# Patient Record
Sex: Male | Born: 1975 | Race: Black or African American | Hispanic: No | Marital: Single | Smoking: Never smoker
Health system: Southern US, Community
[De-identification: ages and names within clinical notes are randomized; demographics above are authoritative.]

## PROBLEM LIST (undated history)

## (undated) DIAGNOSIS — Z8619 Personal history of other infectious and parasitic diseases: Secondary | ICD-10-CM

## (undated) DIAGNOSIS — F419 Anxiety disorder, unspecified: Secondary | ICD-10-CM

## (undated) DIAGNOSIS — U071 COVID-19: Secondary | ICD-10-CM

## (undated) DIAGNOSIS — F4310 Post-traumatic stress disorder, unspecified: Secondary | ICD-10-CM

## (undated) HISTORY — PX: KNEE ARTHROSCOPY: SHX127

## (undated) HISTORY — DX: Personal history of other infectious and parasitic diseases: Z86.19

---

## 2007-09-14 ENCOUNTER — Emergency Department (HOSPITAL_COMMUNITY): Admission: EM | Admit: 2007-09-14 | Discharge: 2007-09-14 | Payer: Self-pay | Admitting: Family Medicine

## 2010-01-20 ENCOUNTER — Emergency Department (HOSPITAL_COMMUNITY): Admission: EM | Admit: 2010-01-20 | Discharge: 2010-01-20 | Payer: Self-pay | Admitting: Emergency Medicine

## 2011-07-26 ENCOUNTER — Inpatient Hospital Stay (INDEPENDENT_AMBULATORY_CARE_PROVIDER_SITE_OTHER)
Admission: RE | Admit: 2011-07-26 | Discharge: 2011-07-26 | Disposition: A | Discharge status: Home / Self Care | Payer: Medicaid Other | Source: Ambulatory Visit | Attending: Family Medicine | Admitting: Family Medicine

## 2011-07-26 ENCOUNTER — Ambulatory Visit (INDEPENDENT_AMBULATORY_CARE_PROVIDER_SITE_OTHER): Payer: Medicaid Other

## 2011-07-26 DIAGNOSIS — W2209XA Striking against other stationary object, initial encounter: Secondary | ICD-10-CM

## 2011-07-26 DIAGNOSIS — S99929A Unspecified injury of unspecified foot, initial encounter: Secondary | ICD-10-CM

## 2011-07-26 DIAGNOSIS — M79609 Pain in unspecified limb: Secondary | ICD-10-CM

## 2011-07-26 DIAGNOSIS — S8990XA Unspecified injury of unspecified lower leg, initial encounter: Secondary | ICD-10-CM

## 2011-09-17 ENCOUNTER — Encounter: Payer: Self-pay | Admitting: Emergency Medicine

## 2011-09-17 ENCOUNTER — Emergency Department (HOSPITAL_COMMUNITY)
Admission: EM | Admit: 2011-09-17 | Discharge: 2011-09-17 | Disposition: A | Discharge status: Home / Self Care | Payer: Medicaid Other | Source: Home / Self Care

## 2011-09-17 DIAGNOSIS — J209 Acute bronchitis, unspecified: Secondary | ICD-10-CM

## 2011-09-17 MED ORDER — PROMETHAZINE-CODEINE 6.25-10 MG/5ML PO SYRP: ORAL_SOLUTION | ORAL | Status: AC

## 2011-09-17 MED ORDER — ALBUTEROL SULFATE HFA 108 (90 BASE) MCG/ACT IN AERS: 2.0000 | INHALATION_SPRAY | RESPIRATORY_TRACT | Status: DC | PRN

## 2011-09-17 NOTE — ED Notes (Signed)
Onset 09/15/2011.  Reports difficulty breathing with lying down--onset last night.  Burning in chest with cough, green phlegm, reports breathing worsens at night.  Sore throat on Tuesday, but has gone away.  Generalized weakness

## 2011-09-17 NOTE — ED Notes (Signed)
Denies comfort measures: blanket

## 2011-09-17 NOTE — ED Provider Notes (Signed)
History     CSN: [id number] Arrival date & time: 09/17/2011  8:55 AM   None     Chief Complaint  Patient presents with  . URI    (Consider location/radiation/quality/duration/timing/severity/associated sxs/prior treatment) HPI Comments: Onset of feeling weak yesterday while at work. Later began to cough. Left work, went home and took Tylenol Cold and Flu. Slept a couple of hrs then awoke with "chest hurting and coughed all night."  He felt short of breath but no wheezing. States he feels better this morning - no longer has chest discomfort or shortness of breath, and cough is less though continues. He has had a scratchy throat for one week and mild nasal congestion x 2 days. No fever.   Patient is a 35 y.o. male presenting with cough. The history is provided by the patient.  Cough This is a new problem. The current episode started 12 to 24 hours ago. The problem occurs every few minutes. The problem has been gradually improving. The cough is non-productive. There has been no fever. Associated symptoms include chest pain, chills, rhinorrhea and shortness of breath. Pertinent negatives include no ear pain, no sore throat, no myalgias and no wheezing. He has tried cough syrup for the symptoms. The treatment provided mild relief. His past medical history does not include asthma.    History reviewed. No pertinent past medical history.  Past Surgical History  Procedure Date  . Knee arthroscopy     left knee    History reviewed. No pertinent family history.  History  Substance Use Topics  . Smoking status: Never Smoker   . Smokeless tobacco: Not on file  . Alcohol Use: No      Review of Systems  Constitutional: Positive for chills. Negative for fever and fatigue.  HENT: Positive for rhinorrhea and postnasal drip. Negative for ear pain, sore throat, sneezing and sinus pressure.   Respiratory: Positive for cough and shortness of breath. Negative for wheezing.   Cardiovascular:  Positive for chest pain. Negative for palpitations.  Musculoskeletal: Negative for myalgias.    Allergies  Review of patient's allergies indicates no known allergies.  Home Medications   Current Outpatient Rx  Name Route Sig Dispense Refill  . OVER THE COUNTER MEDICATION       . ALBUTEROL SULFATE HFA 108 (90 BASE) MCG/ACT IN AERS Inhalation Inhale 2 puffs into the lungs every 4 (four) hours as needed for wheezing. 1 Inhaler 0  . PROMETHAZINE-CODEINE 6.25-10 MG/5ML PO SYRP  1-2 tsp every 6 hrs prn cough 120 mL 0    BP 134/85  Pulse 68  Temp(Src) 98.3 F (36.8 C) (Oral)  Resp 18  SpO2 100%  Physical Exam  Nursing note and vitals reviewed. Constitutional: He appears well-developed and well-nourished. No distress.  HENT:  Head: Normocephalic and atraumatic.  Right Ear: Tympanic membrane, external ear and ear canal normal.  Left Ear: Tympanic membrane, external ear and ear canal normal.  Nose: Nose normal.  Mouth/Throat: Uvula is midline, oropharynx is clear and moist and mucous membranes are normal. No oropharyngeal exudate, posterior oropharyngeal edema or posterior oropharyngeal erythema.  Neck: Neck supple.  Cardiovascular: Normal rate, regular rhythm and normal heart sounds.   Pulmonary/Chest: Effort normal and breath sounds normal. No respiratory distress. He exhibits no tenderness.  Lymphadenopathy:    He has no cervical adenopathy.  Neurological: He is alert.  Skin: Skin is warm and dry.  Psychiatric: He has a normal mood and affect.    ED Course  Procedures (including critical care time)  Labs Reviewed - No data to display No results found.   1. Acute bronchitis       MDM          Melody Comas, PA 09/17/11 1049

## 2011-09-24 NOTE — ED Provider Notes (Signed)
Medical screening examination/treatment/procedure(s) were performed by non-physician practitioner and as supervising physician I was immediately available for consultation/collaboration.  LYKINS,KIMBERLY G  D.O.    Randa Spike, MD 09/24/11 [phone]

## 2014-05-16 ENCOUNTER — Encounter (HOSPITAL_BASED_OUTPATIENT_CLINIC_OR_DEPARTMENT_OTHER): Payer: Self-pay | Admitting: Emergency Medicine

## 2014-05-16 ENCOUNTER — Emergency Department (HOSPITAL_BASED_OUTPATIENT_CLINIC_OR_DEPARTMENT_OTHER)
Admission: EM | Admit: 2014-05-16 | Discharge: 2014-05-16 | Disposition: A | Discharge status: Home / Self Care | Payer: BC Managed Care – PPO | Attending: Emergency Medicine | Admitting: Emergency Medicine

## 2014-05-16 DIAGNOSIS — S4980XA Other specified injuries of shoulder and upper arm, unspecified arm, initial encounter: Secondary | ICD-10-CM | POA: Insufficient documentation

## 2014-05-16 DIAGNOSIS — S0993XA Unspecified injury of face, initial encounter: Secondary | ICD-10-CM | POA: Insufficient documentation

## 2014-05-16 DIAGNOSIS — Y9289 Other specified places as the place of occurrence of the external cause: Secondary | ICD-10-CM | POA: Insufficient documentation

## 2014-05-16 DIAGNOSIS — X500XXA Overexertion from strenuous movement or load, initial encounter: Secondary | ICD-10-CM | POA: Insufficient documentation

## 2014-05-16 DIAGNOSIS — S199XXA Unspecified injury of neck, initial encounter: Principal | ICD-10-CM

## 2014-05-16 DIAGNOSIS — S46909A Unspecified injury of unspecified muscle, fascia and tendon at shoulder and upper arm level, unspecified arm, initial encounter: Secondary | ICD-10-CM | POA: Insufficient documentation

## 2014-05-16 DIAGNOSIS — Y9389 Activity, other specified: Secondary | ICD-10-CM | POA: Insufficient documentation

## 2014-05-16 DIAGNOSIS — M791 Myalgia, unspecified site: Secondary | ICD-10-CM

## 2014-05-16 DIAGNOSIS — M542 Cervicalgia: Secondary | ICD-10-CM

## 2014-05-16 MED ORDER — METHOCARBAMOL 500 MG PO TABS: 500.0000 mg | ORAL_TABLET | Freq: Two times a day (BID) | ORAL | Status: DC

## 2014-05-16 MED ORDER — NAPROXEN 500 MG PO TABS: 500.0000 mg | ORAL_TABLET | Freq: Two times a day (BID) | ORAL | Status: DC

## 2014-05-16 NOTE — ED Notes (Signed)
MD at bedside. 

## 2014-05-16 NOTE — Discharge Instructions (Signed)

## 2014-05-16 NOTE — ED Provider Notes (Signed)
CSN: [id number]     Arrival date & time 05/16/14  7829 History   First MD Initiated Contact with Patient 05/16/14 0818     Chief Complaint  Patient presents with  . Shoulder Pain     HPI  Patient presents with right anterior neck pain and shoulder pain. He awakened one morning and shoulders neck was stiff and sore. 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 He does notice that his pain is worse when he works, and at the end of the day.  No numbness weakness tingling or lack of coordination or strength of the upper extremity.  History reviewed. No pertinent past medical history. Past Surgical History  Procedure Laterality Date  . Knee arthroscopy      left knee   History reviewed. No pertinent family history. History  Substance Use Topics  . Smoking status: Never Smoker   . Smokeless tobacco: Not on file  . Alcohol Use: No    Review of Systems  Constitutional: Negative for fever, chills, diaphoresis, appetite change and fatigue.  HENT: Negative for mouth sores, sore throat and trouble swallowing.   Eyes: Negative for visual disturbance.  Respiratory: Negative for cough, chest tightness, shortness of breath and wheezing.   Cardiovascular: Negative for chest pain.  Gastrointestinal: Negative for nausea, vomiting, abdominal pain, diarrhea and abdominal distention.  Endocrine: Negative for polydipsia, polyphagia and polyuria.  Genitourinary: Negative for dysuria, frequency and hematuria.  Musculoskeletal: Positive for arthralgias and neck pain. Negative for gait problem.  Skin: Negative for color change, pallor and rash.  Neurological: Negative for dizziness, syncope, light-headedness and headaches.  Hematological: Does not bruise/bleed easily.  Psychiatric/Behavioral: Negative for behavioral problems and confusion.      Allergies  Review of patient's allergies indicates no known allergies.  Home Medications    Prior to Admission medications   Medication Sig Start Date End Date Taking? Authorizing Provider  albuterol (PROVENTIL HFA;VENTOLIN HFA) 108 (90 BASE) MCG/ACT inhaler Inhale 2 puffs into the lungs every 4 (four) hours as needed for wheezing. 09/17/11 09/16/12  Dawn Vidal Schwalbe, PA-C  methocarbamol (ROBAXIN) 500 MG tablet Take 1 tablet (500 mg total) by mouth 2 (two) times daily. 05/16/14   Rolland Porter, MD  naproxen (NAPROSYN) 500 MG tablet Take 1 tablet (500 mg total) by mouth 2 (two) times daily. 05/16/14   Rolland Porter, MD  OVER THE COUNTER MEDICATION      Historical Provider, MD   BP 136/92  Pulse 82  Temp(Src) 98.4 F (36.9 C) (Oral)  Resp 16  Ht  (1.854 m)  Wt 222 lb (100.699 kg)  BMI 29.30 kg/m2  SpO2 100% Physical Exam  Constitutional: He is oriented to person, place, and time. He appears well-developed and well-nourished. No distress.  HENT:  Head: Normocephalic.  Eyes: Conjunctivae are normal. Pupils are equal, round, and reactive to light. No scleral icterus.  Neck: Normal range of motion. Neck supple. No thyromegaly present.    Pain along the posterior aspect of sternocleidomastoid. Poor range of motion the shoulder. No pain with rotators. Does not have a painful ARC. Normal grip strength. Normal and adduction of his fingers. Normal flexion extension at the elbow. Normal sensation to the upper extremities. No radiation of pain the Spurling's maneuver.  Cardiovascular: Normal rate and regular rhythm.  Exam reveals no gallop and no friction rub.   No murmur heard. Pulmonary/Chest: Effort normal and breath  sounds normal. No respiratory distress. He has no wheezes. He has no rales.  Abdominal: Soft. Bowel sounds are normal. He exhibits no distension. There is no tenderness. There is no rebound.  Musculoskeletal: Normal range of motion.  Neurological: He is alert and oriented to person, place, and time.  Skin: Skin is warm and dry. No rash noted.  Psychiatric: He has a normal  mood and affect. His behavior is normal.    ED Course  Procedures (including critical care time) Labs Review Labs Reviewed - No data to display  Imaging Review No results found.   EKG Interpretation None      MDM   Final diagnoses:  Muscular pain  Neck pain    Pain is reproducible. Spurling's sign is normal. His pain is not radicular. Normal strength in upper extremity and to all distributions. No Lhermitte's sign.  Pain seems muscular. Plan will be anti-inflammatories and muscle relaxants.    Rolland Porter, MD 05/16/14 [phone]

## 2014-05-16 NOTE — ED Notes (Signed)
Pt amb to room 9 with quick steady gait smiling in nad. Pt reports right shoulder pain x 2 weeks, states "I woke up one morning and had a crick in my neck, and since then I've had this pain in my shoulder" pt states at times pain radiates down his arm to his fingers, along with some numbness and tingling in his fingertips, none at this time.

## 2014-05-24 ENCOUNTER — Encounter: Payer: Self-pay | Admitting: Family Medicine

## 2014-05-24 ENCOUNTER — Ambulatory Visit (INDEPENDENT_AMBULATORY_CARE_PROVIDER_SITE_OTHER): Payer: BC Managed Care – PPO | Admitting: Family Medicine

## 2014-05-24 VITALS — BP 146/91 | HR 83 | Ht 73.0 in | Wt 225.0 lb

## 2014-05-24 DIAGNOSIS — S99919A Unspecified injury of unspecified ankle, initial encounter: Secondary | ICD-10-CM

## 2014-05-24 DIAGNOSIS — M5010 Cervical disc disorder with radiculopathy, unspecified cervical region: Secondary | ICD-10-CM

## 2014-05-24 DIAGNOSIS — M5412 Radiculopathy, cervical region: Secondary | ICD-10-CM

## 2014-05-24 DIAGNOSIS — S8990XA Unspecified injury of unspecified lower leg, initial encounter: Secondary | ICD-10-CM

## 2014-05-24 DIAGNOSIS — S8992XA Unspecified injury of left lower leg, initial encounter: Secondary | ICD-10-CM

## 2014-05-24 DIAGNOSIS — S99929A Unspecified injury of unspecified foot, initial encounter: Secondary | ICD-10-CM

## 2014-05-24 MED ORDER — CYCLOBENZAPRINE HCL 10 MG PO TABS: 10.0000 mg | ORAL_TABLET | Freq: Three times a day (TID) | ORAL | Status: DC | PRN

## 2014-05-24 MED ORDER — PREDNISONE (PAK) 10 MG PO TABS: ORAL_TABLET | ORAL | Status: DC

## 2014-05-24 MED ORDER — HYDROCODONE-ACETAMINOPHEN 5-325 MG PO TABS: 1.0000 | ORAL_TABLET | Freq: Four times a day (QID) | ORAL | Status: DC | PRN

## 2014-05-24 NOTE — Patient Instructions (Signed)
You have cervical radiculopathy (a pinched nerve in the neck). Prednisone 6 day dose pack to relieve irritation/inflammation of the nerve. Aleve 2 tabs twice a day with food for pain and inflammation - start day AFTER finishing prednisone if prescribed this. Flexeril three times a day as needed for muscle spasms (can make you sleepy - if so do not drive while taking this). Norco or percocet for severe pain (no driving on this medicine). Consider cervical collar if severely painful. Simple range of motion exercises within limits of pain to prevent further stiffness. Consider physical therapy for stretching, exercises, traction, and modalities. Heat 15 minutes at a time 3-4 times a day to help with spasms. Watch head position when on computers, texting, when sleeping in bed - should in line with back to prevent further nerve traction and irritation. Consider home traction unit if you get benefit with this in physical therapy. If not improving we will consider an MRI.

## 2014-05-28 ENCOUNTER — Encounter: Payer: Self-pay | Admitting: Family Medicine

## 2014-05-28 DIAGNOSIS — S8992XA Unspecified injury of left lower leg, initial encounter: Secondary | ICD-10-CM | POA: Insufficient documentation

## 2014-05-28 NOTE — Assessment & Plan Note (Signed)
start with prednisone dose pack, transition to aleve.  Flexeril, norco as needed.  Call in 1 week to let me know how he's doing - can add physical therapy if improving. Consider imaging if the same or worsening.  Discussed ergonomic issues as well.

## 2014-05-28 NOTE — Progress Notes (Signed)
Patient ID: Oscar BuntingRobert C Garrett, male   DOB: 1976-07-04, 38 y.o.   MRN: [id number]  PCP: Per Patient No Pcp  Subjective:   HPI: Patient is a 38 y.o. male here for right neck/shoulder pain.  Patient reports about 1 month ago he woke up with pain in right side of neck. Since then has worsened, now 9/10 level of pain. Radiates down into right hand and fingers with associated tingling. 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 Tried naproxen, muscle relaxant. Working more overtime recently as well. No bowel/bladder dysfunction.  History reviewed. No pertinent past medical history.  No current outpatient prescriptions on file prior to visit.   No current facility-administered medications on file prior to visit.    Past Surgical History  Procedure Laterality Date  . Knee arthroscopy      left knee acl reconstruction    No Known Allergies  History   Social History  . Marital Status: Single    Spouse Name: N/A    Number of Children: N/A  . Years of Education: N/A   Occupational History  . Not on file.   Social History Main Topics  . Smoking status: Never Smoker   . Smokeless tobacco: Not on file  . Alcohol Use: No  . Drug Use: No  . Sexual Activity: Not on file   Other Topics Concern  . Not on file   Social History Narrative  . No narrative on file    History reviewed. No pertinent family history.  BP 146/91  Pulse 83  Ht 6\' 1"  (1.854 m)  Wt 225 lb (102.059 kg)  BMI 29.69 kg/m2  Review of Systems: See HPI above.    Objective:  Physical Exam:  Gen: NAD  Neck: No gross deformity, swelling, bruising. TTP right cervical paraspinal region.  No midline/bony TTP. FROM neck - pain with bilateral lateral rotations and extension. BUE strength 5/5.   Sensation intact to light touch currently. 2+ equal reflexes in triceps, biceps, brachioradialis tendons. Negative spurlings.  R shoulder: No swelling, ecchymoses.  No gross deformity. No  TTP. FROM. Negative Hawkins, Neers. Strength 5/5 with empty can and resisted internal/external rotation.    Assessment & Plan:  1. Cervical radiculopathy - start with prednisone dose pack, transition to aleve.  Flexeril, norco as needed.  Call in 1 week to let me know how he's doing - can add physical therapy if improving. Consider imaging if the same or worsening.  Discussed ergonomic issues as well.

## 2014-06-06 ENCOUNTER — Encounter: Payer: Self-pay | Admitting: Physician Assistant

## 2014-06-06 ENCOUNTER — Ambulatory Visit (INDEPENDENT_AMBULATORY_CARE_PROVIDER_SITE_OTHER): Payer: BC Managed Care – PPO | Admitting: Physician Assistant

## 2014-06-06 ENCOUNTER — Ambulatory Visit (HOSPITAL_BASED_OUTPATIENT_CLINIC_OR_DEPARTMENT_OTHER)
Admission: RE | Admit: 2014-06-06 | Discharge: 2014-06-06 | Disposition: A | Discharge status: Home / Self Care | Payer: BC Managed Care – PPO | Source: Ambulatory Visit | Attending: Physician Assistant | Admitting: Physician Assistant

## 2014-06-06 VITALS — BP 124/98 | HR 98 | Temp 98.4°F | Resp 16 | Ht 73.0 in | Wt 225.8 lb

## 2014-06-06 DIAGNOSIS — M542 Cervicalgia: Secondary | ICD-10-CM

## 2014-06-06 MED ORDER — PREDNISONE 10 MG PO TABS: ORAL_TABLET | ORAL | Status: DC

## 2014-06-06 NOTE — Progress Notes (Signed)
Patient presents to clinic today to establish care.  Acute Concerns: Patient endorses right shoulder pain that is stabbing in nature that has been present for 3 weeks. Patient denies known trauma or injury. Does lift heavy objects at work. Patient endorses the pain radiates down his right upper extremity and is associated with numbness and tingling. Denies weakness of right upper extremity. Denies radiation of pain elsewhere. Denies history of known degenerative disc disease.  Chronic Issues: Denies significant past medical history.  Past Medical History  Diagnosis Date  . History of chicken pox     Past Surgical History  Procedure Laterality Date  . Knee arthroscopy      left knee acl reconstruction    Current Outpatient Prescriptions on File Prior to Visit  Medication Sig Dispense Refill  . cyclobenzaprine (FLEXERIL) 10 MG tablet Take 1 tablet (10 mg total) by mouth every 8 (eight) hours as needed.  60 tablet  1  . HYDROcodone-acetaminophen (NORCO) 5-325 MG per tablet Take 1 tablet by mouth every 6 (six) hours as needed for moderate pain.  60 tablet  0   No current facility-administered medications on file prior to visit.    No Known Allergies  Family History  Problem Relation Age of Onset  . Breast cancer Mother     Living  . Hypertension Mother   . Healthy Father     Living  . Heart attack Maternal Grandmother   . Hypertension Maternal Grandmother   . Heart defect Maternal Uncle   . Heart disease Maternal Uncle   . Diabetes Maternal Aunt   . Diabetes Paternal Aunt     x2  . Healthy Brother     x1  . Healthy Sister     x1  . Aneurysm Paternal Aunt   . Hypertension Daughter     Controlled  . Sickle cell trait Son     x2    History   Social History  . Marital Status: Single    Spouse Name: N/A    Number of Children: N/A  . Years of Education: N/A   Occupational History  . Not on file.   Social History Main Topics  . Smoking status: Never Smoker   .  Smokeless tobacco: Never Used  . Alcohol Use: No  . Drug Use: No  . Sexual Activity: Yes    Birth Control/ Protection: None     Comment: women   Other Topics Concern  . Not on file   Social History Narrative  . No narrative on file   ROS See history of present illness. All other review of systems are negative.  BP 124/98  Pulse 98  Temp(Src) 98.4 F (36.9 C) (Oral)  Resp 16  Ht 6\' 1"  (1.854 m)  Wt 225 lb 12 oz (102.4 kg)  BMI 29.79 kg/m2  SpO2 96%  Physical Exam  Vitals reviewed. Constitutional: He is oriented to person, place, and time and well-developed, well-nourished, and in no distress.  HENT:  Head: Normocephalic and atraumatic.  Right Ear: External ear normal.  Left Ear: External ear normal.  Nose: Nose normal.  Mouth/Throat: Oropharynx is clear and moist. No oropharyngeal exudate.  Tympanic membranes are within normal limits bilaterally.  Eyes: Conjunctivae are normal. Pupils are equal, round, and reactive to light.  Neck: Neck supple. No spinous process tenderness and no muscular tenderness present. No rigidity.  Cardiovascular: Normal rate, regular rhythm, normal heart sounds and intact distal pulses.   Pulmonary/Chest: Effort normal and breath sounds  normal. No respiratory distress. He has no wheezes. He has no rales. He exhibits no tenderness.  Musculoskeletal:       Right shoulder: He exhibits pain. He exhibits normal range of motion and no tenderness.       Cervical back: He exhibits tenderness, pain and spasm. He exhibits normal range of motion and no bony tenderness.  Lymphadenopathy:    He has no cervical adenopathy.  Neurological: He is alert and oriented to person, place, and time.  Skin: Skin is warm and dry. No rash noted.  Psychiatric: Affect normal.   Assessment/Plan: Cervical pain (neck) Will obtain x-ray of cervical spine. IM Depo-Medrol injection given by nursing staff. Rx prednisone taper to begin next day. Rx Norco and Flexeril. Avoid  heavy lifting or over exertion. Topical Aspercreme. Heating pad. May need referral to specialist pending x-ray results.

## 2014-06-06 NOTE — Progress Notes (Signed)
Pre visit review using our clinic review tool, if applicable. No additional management support is needed unless otherwise documented below in the visit note/SLS  

## 2014-06-06 NOTE — Patient Instructions (Signed)
Please take steroid as directed on the bottle.  Continue Flexeril.  Norco for severe pain.  Apply topical Aspercreme to the shoulder daily.  Avoid heavy lifting.  We will proceed with further imaging and treatment once x-ray has been obtained.  Give some thought to Physical Therapy.

## 2014-06-07 ENCOUNTER — Telehealth: Payer: Self-pay

## 2014-06-07 DIAGNOSIS — M5090 Cervical disc disorder, unspecified, unspecified cervical region: Secondary | ICD-10-CM

## 2014-06-07 MED ORDER — METHYLPREDNISOLONE ACETATE 40 MG/ML IJ SUSP
40.0000 mg | Freq: Once | INTRAMUSCULAR | Status: AC
  Administered 2014-06-06: 40 mg via INTRAMUSCULAR

## 2014-06-07 NOTE — Telephone Encounter (Signed)
Spoke with pt, he's aware. He's willing to have the MRI done. LDM

## 2014-06-08 NOTE — Telephone Encounter (Signed)
MRI order has been placed.  Patient will be contacted regarding appointment.

## 2014-06-11 DIAGNOSIS — M542 Cervicalgia: Secondary | ICD-10-CM | POA: Insufficient documentation

## 2014-06-11 NOTE — Assessment & Plan Note (Signed)
Will obtain x-ray of cervical spine. IM Depo-Medrol injection given by nursing staff. Rx prednisone taper to begin next day. Rx Norco and Flexeril. Avoid heavy lifting or over exertion. Topical Aspercreme. Heating pad. May need referral to specialist pending x-ray results.

## 2014-06-13 ENCOUNTER — Telehealth: Payer: Self-pay | Admitting: Family

## 2014-06-13 ENCOUNTER — Ambulatory Visit (HOSPITAL_BASED_OUTPATIENT_CLINIC_OR_DEPARTMENT_OTHER)
Admission: RE | Admit: 2014-06-13 | Discharge: 2014-06-13 | Disposition: A | Discharge status: Home / Self Care | Payer: BC Managed Care – PPO | Source: Ambulatory Visit | Attending: Physician Assistant | Admitting: Physician Assistant

## 2014-06-13 DIAGNOSIS — M5020 Other cervical disc displacement, unspecified cervical region: Secondary | ICD-10-CM | POA: Diagnosis not present

## 2014-06-13 DIAGNOSIS — M5090 Cervical disc disorder, unspecified, unspecified cervical region: Secondary | ICD-10-CM | POA: Diagnosis present

## 2014-06-13 DIAGNOSIS — M5010 Cervical disc disorder with radiculopathy, unspecified cervical region: Secondary | ICD-10-CM

## 2014-06-13 NOTE — Telephone Encounter (Signed)
LMOM with contact name and number for return call RE: results and further provider instructions/SLS  

## 2014-06-13 NOTE — Telephone Encounter (Signed)
Please contact pt and let him know that I am covering for [facility] and reviewed MRI results.  Shows bulging disc in cervical spine as well as a pinched nerve on right which is likely cause for his pain.  I think he should be evaluated by a neurosurgeon.  Referral pended below.

## 2014-06-13 NOTE — Telephone Encounter (Signed)
Result Notes    Notes Recorded by Robyne Askew, CMA on 06/07/2014 at 3:25 PM Spoke with pt, he's aware LDM ------  Notes Recorded by Robyne Askew, CMA on 06/07/2014 at 9:39 AM LMOVM LDM ------  Notes Recorded by Robyne Askew, CMA on 06/06/2014 at 2:18 PM Called pt, no answer. Left messageon voice mail to call office. LDM ------  Notes Recorded by Waldon Merl, PA-C on 06/06/2014 at 11:04 AM X-ray shows decreased intervertebral disc space in the cervical spine (neck). This is an indicator of potential disc herniation and nerve compression. Continue care as discussed at visit today. I would like to proceed with MRI to further evaluate if the patient is willing. Please let me know what he decides.

## 2014-06-13 NOTE — Telephone Encounter (Signed)
Pt stated that" he was written out of work through the 27th, will he be seen by neurosurgeon before then or after?" LDM

## 2014-06-13 NOTE — Telephone Encounter (Signed)
There is no way to know when the surgeon will be able to see him until referral is placed.  We can try to make it urgent so he will be seen sooner but unlikely he will be seen before the 27th.  He is allowed to go back to work, but it may exacerbate symptoms.  I can always right him out or place him on work restrictions.

## 2014-06-14 NOTE — Telephone Encounter (Signed)
Patient informed, understood and request letter to be pick-up with Disability paperwork today stating that he is written out of work until he sees the Careers adviser; provider extended length of period to be out of work until 09.27.15/SLS

## 2015-08-23 ENCOUNTER — Ambulatory Visit (INDEPENDENT_AMBULATORY_CARE_PROVIDER_SITE_OTHER): Payer: BLUE CROSS/BLUE SHIELD | Admitting: Physician Assistant

## 2015-08-23 VITALS — BP 122/76 | HR 82 | Temp 98.3°F | Resp 16 | Ht 73.0 in | Wt 216.2 lb

## 2015-08-23 DIAGNOSIS — Z23 Encounter for immunization: Secondary | ICD-10-CM

## 2015-08-23 DIAGNOSIS — Z0000 Encounter for general adult medical examination without abnormal findings: Secondary | ICD-10-CM

## 2015-08-23 NOTE — Progress Notes (Signed)
  Tuberculosis Risk Questionnaire`  1. No Were you born outside the [place] in one of the following parts of the world: [place], [place], [place], [place] or [place]?    2. No Have you traveled outside the [place] and lived for more than one month in one of the following parts of the world: [place], [place], [place], [place] or [place]?    3. No Do you have a compromised immune system such as from any of the following conditions:HIV/AIDS, organ or bone marrow transplantation, diabetes, immunosuppressive medicines (e.g. Prednisone, Remicaide), leukemia, lymphoma, cancer of the head or neck, gastrectomy or jejunal bypass, end-stage renal disease (on dialysis), or silicosis?     4. Yes  Have you ever or do you plan on working in: a residential care center (group home), a health care facility, a jail or prison or homeless shelter?    5. No Have you ever: injected illegal drugs, used crack cocaine, lived in a homeless shelter  or been in jail or prison?     6. No Have you ever been exposed to anyone with infectious tuberculosis?    Tuberculosis Symptom Questionnaire  Do you currently have any of the following symptoms?  1. No Unexplained cough lasting more than 3 weeks?   2. No Unexplained fever lasting more than 3 weeks.   3. No Night Sweats (sweating that leaves the bedclothes and sheets wet)     4. No Shortness of Breath   5. No Chest Pain   6. No Unintentional weight loss    7. No Unexplained fatigue (very tired for no reason)

## 2015-08-23 NOTE — Progress Notes (Signed)
Oscar Garrett  MRN: [id number] DOB: Jan 23, 1976  Subjective:  Pt presents to clinic for a CPE.  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  He has no concerns or problems today.  Last dental exam: 1.5 years ago Last vision exam: wears glasses about 1.5 years ago  Vaccinations      Tetanus - unsure - in 90s when he was in college      Flu vaccine - going to get it at his job next week  Patient Active Problem List   Diagnosis Date Noted  . Cervical pain (neck) 06/11/2014  . Left knee injury 05/28/2014    No current outpatient prescriptions on file prior to visit.   No current facility-administered medications on file prior to visit.    No Known Allergies  Social History   Social History  . Marital Status: Single    Spouse Name: N/A  . Number of Children: N/A  . Years of Education: N/A   Social History Main Topics  . Smoking status: Never Smoker   . Smokeless tobacco: Never Used  . Alcohol Use: No  . Drug Use: No  . Sexual Activity:    Partners: Female    Copy: None     Comment: women   Other Topics Concern  . None   Social History Narrative   [employer] - works with powder coated paint   Single   Children - 4 children   Exercise - 2x/week       Past Surgical History  Procedure Laterality Date  . Knee arthroscopy      left knee acl reconstruction    Family History  Problem Relation Age of Onset  . Breast cancer Mother     Living  . Hypertension Mother   . Healthy Father     Living  . Heart attack Maternal Grandmother   . Hypertension Maternal Grandmother   . Heart defect Maternal Uncle   . Heart disease Maternal Uncle   . Diabetes Maternal Aunt   . Diabetes Paternal Aunt     x2  . Healthy Brother     x1  . Healthy Sister     x1  . Aneurysm Paternal Aunt   . Hypertension Daughter     Controlled  . Sickle cell trait Son   . Sickle cell trait Son     Review of Systems  Constitutional: Negative.     HENT: Negative.   Eyes: Negative.   Respiratory: Negative.   Cardiovascular: Negative.   Gastrointestinal: Negative.   Endocrine: Negative.   Genitourinary: Negative.   Musculoskeletal: Negative.   Skin: Negative.   Allergic/Immunologic: Negative.   Neurological: Negative.   Hematological: Negative.   Psychiatric/Behavioral: Negative.    Objective:  BP 122/76 mmHg  Pulse 82  Temp(Src) 98.3 F (36.8 C) (Oral)  Resp 16  Ht  (1.854 m)  Wt 216 lb 3.2 oz (98.068 kg)  BMI 28.53 kg/m2  SpO2 99%  Physical Exam  Constitutional: He is oriented to person, place, and time and well-developed, well-nourished, and in no distress.  HENT:  Head: Normocephalic and atraumatic.  Right Ear: Hearing, tympanic membrane, external ear and ear canal normal.  Left Ear: Hearing, tympanic membrane, external ear and ear canal normal.  Nose: Nose normal.  Mouth/Throat: Uvula is midline, oropharynx is clear and moist and mucous membranes are normal.  Eyes: Conjunctivae and EOM are normal. Pupils are equal, round, and reactive to light.  Neck:  Trachea normal and normal range of motion. Neck supple. No thyroid mass and no thyromegaly present.  Cardiovascular: Normal rate, regular rhythm and normal heart sounds.   No murmur heard. Pulmonary/Chest: Effort normal and breath sounds normal.  Abdominal: Soft. Bowel sounds are normal.  Genitourinary: Penis normal.  Musculoskeletal: Normal range of motion.  Neurological: He is alert and oriented to person, place, and time. Gait normal.  Skin: Skin is warm and dry.  Psychiatric: Mood, memory, affect and judgment normal.     Visual Acuity Screening   Right eye Left eye Both eyes  Without correction: 20/30 20/50 20/30   With correction:       Assessment and Plan :  Annual physical exam - formed filled out - the season is over and he has no signs of symptoms of TB so we did not place a PPD - he understands that they may want this and he will return if  that is the case.  He declined blood work today.  Anticipatory guidnace given to the patient.  Need for diphtheria-tetanus-pertussis (Tdap) vaccine, adult/adolescent - Plan: Tdap vaccine greater than or equal to 7yo IM  Benny LennertSarah Weber PA-C  [facility] 08/23/2015 11:12 AM

## 2015-08-23 NOTE — Patient Instructions (Signed)

## 2015-09-06 NOTE — Progress Notes (Signed)
  Medical screening examination/treatment/procedure(s) were performed by non-physician practitioner and as supervising physician I was immediately available for consultation/collaboration.     

## 2015-09-06 NOTE — Addendum Note (Signed)
Addended by: Carmelina DaneANDERSON, Kadarius Cuffe S on: 09/06/2015 12:56 PM   Modules accepted: Kipp BroodSmartSet

## 2015-09-16 ENCOUNTER — Ambulatory Visit (INDEPENDENT_AMBULATORY_CARE_PROVIDER_SITE_OTHER): Payer: BLUE CROSS/BLUE SHIELD | Admitting: Physician Assistant

## 2015-09-16 VITALS — BP 134/86 | HR 98 | Temp 98.0°F | Resp 18 | Ht 72.0 in | Wt 222.0 lb

## 2015-09-16 DIAGNOSIS — M5442 Lumbago with sciatica, left side: Secondary | ICD-10-CM

## 2015-09-16 MED ORDER — PREDNISONE 10 MG PO TABS: ORAL_TABLET | ORAL | Status: AC

## 2015-09-16 MED ORDER — CYCLOBENZAPRINE HCL ER 15 MG PO CP24: 15.0000 mg | ORAL_CAPSULE | Freq: Every day | ORAL | Status: DC | PRN

## 2015-09-16 MED ORDER — METAXALONE 800 MG PO TABS: 400.0000 mg | ORAL_TABLET | Freq: Three times a day (TID) | ORAL | Status: DC

## 2015-09-16 NOTE — Progress Notes (Signed)
   Stana BuntingRobert C Stoudt  MRN: [id number] DOB: March 26, 1976  Subjective:  Pt presents to clinic with week history of left sided back and hip pain.  Some pain radiation into the left leg with movement esp with bending.  No numbness or tingling.  No bowel or bladder control issues.  Pain is present all of the time but it increases with movement.  He has tried stretching and Tylenol arthritis.  NKI.  No history of back problems.   Lifts and stands at work.  There are no active problems to display for this patient.   No current outpatient prescriptions on file prior to visit.   No current facility-administered medications on file prior to visit.    No Known Allergies  Review of Systems  Musculoskeletal: Positive for back pain and gait problem (2nd to pain).   Objective:  BP 134/86 mmHg  Pulse 98  Temp(Src) 98 F (36.7 C) (Oral)  Resp 18  Ht 6' (1.829 m)  Wt 222 lb (100.699 kg)  BMI 30.10 kg/m2  SpO2 98%  Physical Exam  Constitutional: He is oriented to person, place, and time and well-developed, well-nourished, and in no distress.  HENT:  Head: Normocephalic and atraumatic.  Right Ear: External ear normal.  Left Ear: External ear normal.  Eyes: Conjunctivae are normal.  Neck: Normal range of motion.  Pulmonary/Chest: Effort normal.  Musculoskeletal:       Left hip: He exhibits decreased strength (with flexion but due to pain in his back).  TTP over sciatic nerve location, neg SLR but patient does have pain in his left back and hip, no TTP over trochanteric bursa  Neurological: He is alert and oriented to person, place, and time. He has normal sensation, normal strength and normal reflexes. He displays no weakness. He has a normal Straight Leg Raise Test. Gait (antalgic) abnormal.  Skin: Skin is warm and dry.  Psychiatric: Mood, memory, affect and judgment normal.    Assessment and Plan :  Left-sided low back pain with left-sided sciatica - Plan: cyclobenzaprine (AMRIX) 15 MG  24 hr capsule, predniSONE (DELTASONE) 10 MG tablet, metaxalone (SKELAXIN) 800 MG tablet   Heat - and gentle stretches - if no better RTC in 1 week - d/w pt how to take and what to expect from prednisone.  Benny LennertSarah Weber PA-C  [facility] 09/16/2015 12:09 PM

## 2015-09-16 NOTE — Patient Instructions (Signed)

## 2015-09-19 ENCOUNTER — Ambulatory Visit (INDEPENDENT_AMBULATORY_CARE_PROVIDER_SITE_OTHER): Payer: BLUE CROSS/BLUE SHIELD | Admitting: Physician Assistant

## 2015-09-19 ENCOUNTER — Ambulatory Visit (INDEPENDENT_AMBULATORY_CARE_PROVIDER_SITE_OTHER): Payer: BLUE CROSS/BLUE SHIELD

## 2015-09-19 VITALS — BP 138/92 | HR 82 | Temp 98.4°F | Resp 16 | Ht 72.25 in | Wt 218.0 lb

## 2015-09-19 DIAGNOSIS — M5442 Lumbago with sciatica, left side: Secondary | ICD-10-CM

## 2015-09-19 MED ORDER — TRAMADOL HCL 50 MG PO TABS: 50.0000 mg | ORAL_TABLET | Freq: Three times a day (TID) | ORAL | Status: DC | PRN

## 2015-09-19 NOTE — Progress Notes (Signed)
Patient ID: Oscar BuntingRobert C Garrett, male    DOB: 1976-08-03, 39 y.o.   MRN: [id number]  PCP: Piedad ClimesMartin, William Cody, PA-C  Subjective:   Chief Complaint  Patient presents with  . Hip Pain    left hip    HPI Presents for evaluation of LBP with LEFT leg radiculopathy. He is accompanied by his young adult daughter.  Intermittent LBP into the hip x 3-4 months, with considerably worsening symptoms about 10 days ago.  He was seen here on 09/16/2015 with 7 days of symptoms. LBP with pain radiating into the LEFT hip/leg. He was diagnosed with sciatica and prescribed a 6-day oral steroid taper and muscle relaxer.  Started the prednisone that day, but didn't start the muscle relaxer until the next day. Made him really sleepy, and slept the entire next next. When he woke up, he felt high, so he hasn't been taking either the Skelaxin nor the Amrix. Has been applying heat.  Work requires lifting 50 lb boxes frequently, and last night had to go up many flights of stairs. He works evening/night shift, 8 hours 6 nights/week.  Today is no better, despite 3 days of prednisone. Now the pain seems to radiate down to the ankle. Describes a tingling sensation in the anterior ankle. Still in control of his bowel/bladder. No saddle anesthesia. No weakness in the LE, but leg feels shaky when he gets tired.  Review of Systems As above.    There are no active problems to display for this patient.    Prior to Admission medications   Medication Sig Start Date End Date Taking? Authorizing Provider  metaxalone (SKELAXIN) 800 MG tablet Take 0.5-1 tablets (400-800 mg total) by mouth 3 (three) times daily. 09/16/15   Morrell RiddleSarah L Weber, PA-C  predniSONE (DELTASONE) 10 MG tablet 6-1 taper - Start with 6 pills on the 1st day and decreased each day by one pill.  Take pills for that day all together in the am with food. 09/16/15 09/22/15 Yes Sarah Harvie BridgeL Weber, PA-C  cyclobenzaprine (AMRIX) 15 MG 24 hr capsule Take 1 capsule  (15 mg total) by mouth daily as needed for muscle spasms. Patient not taking: Reported on 09/19/2015 09/16/15   Morrell RiddleSarah L Weber, PA-C     No Known Allergies     Objective:  Physical Exam  Constitutional: He is oriented to person, place, and time. Vital signs are normal. He appears well-developed and well-nourished. He is active and cooperative. No distress.  BP 138/92 mmHg  Pulse 82  Temp(Src) 98.4 F (36.9 C) (Oral)  Resp 16  Ht 6' 0.25" (1.835 m)  Wt 218 lb (98.884 kg)  BMI 29.37 kg/m2  SpO2 99%  HENT:  Head: Normocephalic and atraumatic.  Right Ear: Hearing normal.  Left Ear: Hearing normal.  Eyes: Conjunctivae are normal. No scleral icterus.  Neck: Normal range of motion. Neck supple. No thyromegaly present.  Cardiovascular: Normal rate, regular rhythm and normal heart sounds.   Pulses:      Radial pulses are 2+ on the right side, and 2+ on the left side.  Pulmonary/Chest: Effort normal and breath sounds normal.  Musculoskeletal:       Left hip: Normal.       Left knee: Normal.       Left ankle: Normal. Achilles tendon normal.       Cervical back: Normal.       Thoracic back: Normal.       Lumbar back: He exhibits tenderness (LEFT SI )  and pain. He exhibits normal range of motion, no bony tenderness, no swelling, no edema, no deformity, no laceration, no spasm and normal pulse.       Back:       Left upper leg: Normal.       Left lower leg: Normal.       Left foot: Normal.  Lymphadenopathy:       Head (right side): No tonsillar, no preauricular, no posterior auricular and no occipital adenopathy present.       Head (left side): No tonsillar, no preauricular, no posterior auricular and no occipital adenopathy present.    He has no cervical adenopathy.       Right: No supraclavicular adenopathy present.       Left: No supraclavicular adenopathy present.  Neurological: He is alert and oriented to person, place, and time. He has normal strength. No sensory deficit.    Reflex Scores:      Patellar reflexes are 2+ on the right side and 2+ on the left side.      Achilles reflexes are 2+ on the right side and 2+ on the left side. No ankle clonus.   Skin: Skin is warm, dry and intact. No rash noted. No cyanosis or erythema. Nails show no clubbing.  Psychiatric: He has a normal mood and affect. His speech is normal and behavior is normal.     LS Spine: [facility] reading (PRIMARY) by  Dr. Dareen Piano. Normal bones and alignment. Loss of lordosis.       Assessment & Plan:   1. Left-sided low back pain with left-sided sciatica Complete steroid taper. Add Tramadol. Rest. Ice. Out of work due to no modified duty available. Reassess on 09/26/15. MRI. - DG Lumbar Spine Complete; Future - MR Lumbar Spine Wo Contrast; Future - traMADol (ULTRAM) 50 MG tablet; Take 1-2 tablets (50-100 mg total) by mouth every 8 (eight) hours as needed.  Dispense: 30 tablet; Refill: 0   Fernande Bras, PA-C Physician Assistant-Certified [facility]

## 2015-09-19 NOTE — Progress Notes (Signed)
  Medical screening examination/treatment/procedure(s) were performed by non-physician practitioner and as supervising physician I was immediately available for consultation/collaboration.     

## 2015-09-20 ENCOUNTER — Encounter: Payer: Self-pay | Admitting: Physician Assistant

## 2015-09-20 DIAGNOSIS — M47816 Spondylosis without myelopathy or radiculopathy, lumbar region: Secondary | ICD-10-CM | POA: Insufficient documentation

## 2015-09-26 ENCOUNTER — Ambulatory Visit (INDEPENDENT_AMBULATORY_CARE_PROVIDER_SITE_OTHER): Payer: BLUE CROSS/BLUE SHIELD | Admitting: Physician Assistant

## 2015-09-26 VITALS — BP 128/86 | HR 108 | Temp 99.9°F | Resp 18 | Ht 72.0 in | Wt 212.8 lb

## 2015-09-26 DIAGNOSIS — M4316 Spondylolisthesis, lumbar region: Secondary | ICD-10-CM | POA: Diagnosis not present

## 2015-09-26 DIAGNOSIS — M5442 Lumbago with sciatica, left side: Secondary | ICD-10-CM

## 2015-09-26 DIAGNOSIS — M47819 Spondylosis without myelopathy or radiculopathy, site unspecified: Secondary | ICD-10-CM

## 2015-09-26 DIAGNOSIS — Z139 Encounter for screening, unspecified: Secondary | ICD-10-CM

## 2015-09-26 MED ORDER — IBUPROFEN 800 MG PO TABS: ORAL_TABLET | ORAL | Status: DC

## 2015-09-26 MED ORDER — CYCLOBENZAPRINE HCL 10 MG PO TABS: 10.0000 mg | ORAL_TABLET | Freq: Three times a day (TID) | ORAL | Status: DC | PRN

## 2015-09-26 NOTE — Progress Notes (Signed)
  Medical screening examination/treatment/procedure(s) were performed by non-physician practitioner and as supervising physician I was immediately available for consultation/collaboration.     

## 2015-09-26 NOTE — Progress Notes (Signed)
09/26/2015 2:26 PM   DOB: 10-22-1975 / MRN: [id number]  SUBJECTIVE:  Oscar Garrett is a 39 y.o. male presenting for the evaluation of gradually worsening "achy" low back pain that started 1 months ago. Associated symptoms include tingling, and he denies dysuria, new weakness, incontinence, pelvic pain, no other symptoms.Treatments tried thus far include prednisone, muscle relaxors, tramadol is mild relief.   He denies fever, nausea, dysuria, frequency and urgency.   His most recent radiograph reports is included in this note.    He has No Known Allergies.   He  has a past medical history of History of chicken pox.    He  reports that he has never smoked. He has never used smokeless tobacco. He reports that he does not drink alcohol or use illicit drugs. He  reports that he currently engages in sexual activity and has had male partners. He reports using the following method of birth control/protection: None. The patient  has past surgical history that includes Knee arthroscopy.  His family history includes Aneurysm in his paternal aunt; Breast cancer in his mother; Diabetes in his maternal aunt and paternal aunt; Healthy in his brother, father, and sister; Heart attack in his maternal grandmother; Heart defect in his maternal uncle; Heart disease in his maternal uncle; Hypertension in his daughter, maternal grandmother, and mother; Sickle cell trait in his son and son.  Review of Systems  Constitutional: Negative for fever and chills.  Respiratory: Negative for shortness of breath.   Gastrointestinal: Negative for nausea, vomiting and abdominal pain.  Genitourinary: Negative for dysuria, urgency and frequency.  Musculoskeletal: Positive for myalgias and back pain.  Skin: Negative for rash.  Neurological: Negative for dizziness, tingling, focal weakness and headaches.  Psychiatric/Behavioral: The patient does not have insomnia.     Problem list and medications reviewed and updated by  myself where necessary, and exist elsewhere in the encounter.   OBJECTIVE:  BP 128/86 mmHg  Pulse 108  Temp(Src) 99.9 F (37.7 C) (Oral)  Resp 18  Ht 6' (1.829 m)  Wt 212 lb 12.8 oz (96.525 kg)  BMI 28.85 kg/m2  SpO2 98% CrCl cannot be calculated (Patient has no serum creatinine result on file.).  Physical Exam  Constitutional: He is oriented to person, place, and time. He appears well-developed. He does not appear ill.  Eyes: Conjunctivae and EOM are normal. Pupils are equal, round, and reactive to light.  Cardiovascular: Normal rate.   Pulmonary/Chest: Effort normal.  Abdominal: He exhibits no distension.  Musculoskeletal: Normal range of motion.  Neurological: He is alert and oriented to person, place, and time. He has normal strength. No cranial nerve deficit. Coordination and gait normal. He displays no Babinski's sign on the right side. He displays no Babinski's sign on the left side.  Reflex Scores:      Patellar reflexes are 2+ on the right side and 1+ on the left side.      Achilles reflexes are 2+ on the right side and 1+ on the left side. Skin: Skin is warm and dry. He is not diaphoretic.  Psychiatric: He has a normal mood and affect.  Nursing note and vitals reviewed.  CLINICAL DATA: Lumbago. Regular heavy lifting at work  EXAM: LUMBAR SPINE - COMPLETE 4+ VIEW  COMPARISON: None.  FINDINGS: Frontal, lateral, spot lumbosacral lateral, and bilateral oblique views were obtained. There are 5 non-rib-bearing lumbar type vertebral bodies. There is no fracture. There is 5 mm of retrolisthesis of L4 on L5, felt  to be due to spondylosis. No other spondylolisthesis is seen. There is moderate disc space narrowing at L4-5. Other disc spaces appear normal. There is facet osteoarthritic change at L4-5 and L5-S1 bilaterally.  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 Mild spondylolisthesis at L4-5, felt to be due to underlying spondylosis. No  fracture.  No results found for this or any previous visit (from the past 48 hour(s)).  ASSESSMENT AND PLAN  Oscar MaduroRobert was seen today for back pain.  Diagnoses and all orders for this visit:  Bilateral low back pain with left-sided sciatica -     ibuprofen (ADVIL,MOTRIN) 800 MG tablet; Take 1 tab every eight hours for pain -     cyclobenzaprine (FLEXERIL) 10 MG tablet; Take 1 tablet (10 mg total) by mouth 3 (three) times daily as needed for muscle spasms.  Spondylarthritis -     MR Lumbar Spine Wo Contrast; Future  Spondylolisthesis at L4-L5 level -     AMB referral to orthopedics -     MR Lumbar Spine Wo Contrast; Future  Screening -     POCT CBC -     COMPLETE METABOLIC PANEL WITH GFR    The patient was advised to call or return to clinic if he does not see an improvement in symptoms or to seek the care of the closest emergency department if he worsens with the above plan.   Oscar BostonMichael Garrett, MHS, PA-C [facility] 09/26/2015 2:26 PM

## 2015-10-01 ENCOUNTER — Telehealth: Payer: Self-pay | Admitting: Physician Assistant

## 2015-10-01 NOTE — Telephone Encounter (Signed)
Patient's job faxed Attending Physician Statement back to our office. Deliah BostonMichael Clark, PA-C filled this form out originally however patient's employer will not accept this form without the signature of a doctor. Dr. Cleta Albertsaub signed this form and I faxed it back.

## 2015-10-10 ENCOUNTER — Ambulatory Visit
Admission: RE | Admit: 2015-10-10 | Discharge: 2015-10-10 | Disposition: A | Discharge status: Home / Self Care | Payer: BLUE CROSS/BLUE SHIELD | Source: Ambulatory Visit | Attending: Physician Assistant | Admitting: Physician Assistant

## 2015-10-10 ENCOUNTER — Other Ambulatory Visit: Payer: Self-pay | Admitting: Physician Assistant

## 2015-10-10 DIAGNOSIS — M47819 Spondylosis without myelopathy or radiculopathy, site unspecified: Secondary | ICD-10-CM

## 2015-10-10 DIAGNOSIS — M4316 Spondylolisthesis, lumbar region: Secondary | ICD-10-CM

## 2015-10-10 DIAGNOSIS — M48061 Spinal stenosis, lumbar region without neurogenic claudication: Secondary | ICD-10-CM

## 2015-10-21 ENCOUNTER — Encounter: Payer: Self-pay | Admitting: Radiology

## 2015-10-25 ENCOUNTER — Telehealth: Payer: Self-pay

## 2015-10-25 NOTE — Telephone Encounter (Signed)
Patient came to walk in to drop off a form that needs to be filled out and faxed to his employer. Also, patient will be out of work and need a work excuse. It has not been determined when he will return to work. Patient states that he has to wait to get results back from the orthopaedist after 1/10.  The form has been placed in the nurse station box at 102.

## 2015-10-28 NOTE — Telephone Encounter (Signed)
In your box Oscar Garrett.

## 2015-11-01 NOTE — Telephone Encounter (Signed)
This was completed by me and return to front office staff yesterday.  Deliah BostonMichael Samariah Hokenson, MS, PA-C 11:18 AM, 11/01/2015

## 2015-11-01 NOTE — Telephone Encounter (Signed)
Faxed

## 2017-11-20 ENCOUNTER — Other Ambulatory Visit: Payer: Self-pay

## 2017-11-20 ENCOUNTER — Emergency Department (HOSPITAL_BASED_OUTPATIENT_CLINIC_OR_DEPARTMENT_OTHER)
Admission: EM | Admit: 2017-11-20 | Discharge: 2017-11-21 | Disposition: A | Discharge status: Home / Self Care | Payer: BLUE CROSS/BLUE SHIELD | Attending: Emergency Medicine | Admitting: Emergency Medicine

## 2017-11-20 ENCOUNTER — Emergency Department (HOSPITAL_BASED_OUTPATIENT_CLINIC_OR_DEPARTMENT_OTHER): Payer: BLUE CROSS/BLUE SHIELD

## 2017-11-20 ENCOUNTER — Encounter (HOSPITAL_BASED_OUTPATIENT_CLINIC_OR_DEPARTMENT_OTHER): Payer: Self-pay | Admitting: *Deleted

## 2017-11-20 DIAGNOSIS — J111 Influenza due to unidentified influenza virus with other respiratory manifestations: Secondary | ICD-10-CM | POA: Diagnosis not present

## 2017-11-20 DIAGNOSIS — R69 Illness, unspecified: Secondary | ICD-10-CM

## 2017-11-20 DIAGNOSIS — R05 Cough: Secondary | ICD-10-CM | POA: Diagnosis present

## 2017-11-20 MED ORDER — IBUPROFEN 800 MG PO TABS
800.0000 mg | ORAL_TABLET | Freq: Once | ORAL | Status: AC
  Administered 2017-11-21: 800 mg via ORAL
  Filled 2017-11-20: qty 1

## 2017-11-20 NOTE — ED Notes (Signed)
EDP into room, prior to RN assessment, see MD notes, orders received and initiated.   

## 2017-11-20 NOTE — ED Triage Notes (Signed)
Dry cough x 1 day. Low grade temp in triage

## 2017-11-21 MED ORDER — IBUPROFEN 600 MG PO TABS: 600.0000 mg | ORAL_TABLET | Freq: Four times a day (QID) | ORAL | 0 refills | Status: DC | PRN

## 2017-11-21 MED ORDER — OSELTAMIVIR PHOSPHATE 75 MG PO CAPS: 75.0000 mg | ORAL_CAPSULE | Freq: Two times a day (BID) | ORAL | 0 refills | Status: DC

## 2017-11-21 NOTE — Discharge Instructions (Signed)
Seen today for upper respiratory symptoms.  This likely is viral in nature.  It may be early onset of the flu.  Given that your symptoms have only been present for 1 day, you can take Tamiflu which can short in duration.  Otherwise supportive care including ibuprofen for aches and pains and hydration.

## 2017-11-21 NOTE — ED Notes (Signed)
Alert, NAD, calm, interactive, resps e/u, speaking in clear complete sentences, no dyspnea noted, skin W&D, VSS, c/o dry cough, recent low grade fever, and chest discomfort, admits to chronic shoulder pain for ~ 2 months, and some dizziness with coughing episodes, (denies: sore throat, congestion, facial pain, ear ache, sob, NVD, current dizziness, or visual changes). [facility]

## 2017-11-21 NOTE — ED Provider Notes (Signed)
[facility] EMERGENCY DEPARTMENT Provider Note   CSN: [id number] Arrival date & time: 11/20/17  1947     History   Chief Complaint Chief Complaint  Patient presents with  . Cough    HPI Oscar Garrett is a 42 y.o. male.  HPI  This 42 year old male with no significant past medical history who presents with 1 day history of cough, body aches, congestion.  Onset of symptoms yesterday.  Patient reports dry cough.  Denies shortness of breath or chest pain.  Diffuse myalgias.  No noted fevers at home.  Temperature 100.1.  Reports congestion" just feeling bad."  Denies nausea, vomiting, diarrhea.  Wife works at a daycare with positive flu contacts.  Wife does not have any symptoms.  He has not taken anything for his symptoms.  History of smoking.  Past Medical History:  Diagnosis Date  . History of chicken pox     Patient Active Problem List   Diagnosis Date Noted  . 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 KNEE ARTHROSCOPY     left knee acl reconstruction       Home Medications    Prior to Admission medications   Medication Sig Start Date End Date Taking? Authorizing Provider  cyclobenzaprine (AMRIX) 15 MG 24 hr capsule Take 1 capsule (15 mg total) by mouth daily as needed for muscle spasms. 09/16/15   Valarie Cones, Dema Severin, PA-C  cyclobenzaprine (FLEXERIL) 10 MG tablet Take 1 tablet (10 mg total) by mouth 3 (three) times daily as needed for muscle spasms. 09/26/15   Ofilia Neas, PA-C  ibuprofen (ADVIL,MOTRIN) 600 MG tablet Take 1 tablet (600 mg total) by mouth every 6 (six) hours as needed. 11/21/17   Horton, Mayer Masker, MD  ibuprofen (ADVIL,MOTRIN) 800 MG tablet Take 1 tab every eight hours for pain 09/26/15   Ofilia Neas, PA-C  metaxalone (SKELAXIN) 800 MG tablet Take 0.5-1 tablets (400-800 mg total) by mouth 3 (three) times daily. Patient not taking: Reported on 09/19/2015 09/16/15   Morrell Riddle,  PA-C  oseltamivir (TAMIFLU) 75 MG capsule Take 1 capsule (75 mg total) by mouth every 12 (twelve) hours. 11/21/17   Horton, Mayer Masker, MD  traMADol (ULTRAM) 50 MG tablet Take 1-2 tablets (50-100 mg total) by mouth every 8 (eight) hours as needed. 09/19/15   Porfirio Oar, PA-C    Family History Family History  Problem Relation Age of Onset  . Breast cancer Mother        Living  . Hypertension Mother   . Healthy Father        Living  . Healthy Brother        x1  . Healthy Sister        x1  . Hypertension Daughter        Controlled  . Sickle cell trait Son   . Sickle cell trait Son   . Heart attack Maternal Grandmother   . Hypertension Maternal Grandmother   . Heart defect Maternal Uncle   . Heart disease Maternal Uncle   . Diabetes Maternal Aunt   . Diabetes Paternal Aunt        x2  . Aneurysm Paternal Aunt     Social History Social History   Tobacco Use  . Smoking status: Never Smoker  . Smokeless tobacco: Never Used  Substance Use Topics  . Alcohol use: No    Alcohol/week: 0.0 oz  . Drug use:  No     Allergies   Patient has no known allergies.   Review of Systems Review of Systems  Constitutional: Positive for chills. Negative for fever.  HENT: Positive for congestion.   Respiratory: Positive for cough. Negative for shortness of breath.   Cardiovascular: Negative for chest pain.  Gastrointestinal: Negative for abdominal pain, nausea and vomiting.  Skin: Negative for rash.  All other systems reviewed and are negative.    Physical Exam Updated Vital Signs BP 90/73   Pulse 90   Temp 99.9 F (37.7 C) (Oral)   Resp 20   Ht 6' (1.829 m)   Wt 102.1 kg (225 lb)   SpO2 99%   BMI 30.52 kg/m   Physical Exam  Constitutional: He is oriented to person, place, and time. He appears well-developed and well-nourished.  HENT:  Head: Normocephalic and atraumatic.  Mouth/Throat: Oropharynx is clear and moist.  No tonsillar exudate noted, uvula midline  Eyes:  Pupils are equal, round, and reactive to light.  Neck: Neck supple.  Cardiovascular: Normal rate, regular rhythm and normal heart sounds.  No murmur heard. Pulmonary/Chest: Effort normal and breath sounds normal. No respiratory distress. He has no wheezes.  Abdominal: Soft. Bowel sounds are normal. There is no tenderness. There is no rebound.  Musculoskeletal: He exhibits no edema.  Lymphadenopathy:    He has no cervical adenopathy.  Neurological: He is alert and oriented to person, place, and time.  Skin: Skin is warm and dry.  Psychiatric: He has a normal mood and affect.  Nursing note and vitals reviewed.    ED Treatments / Results  Labs (all labs ordered are listed, but only abnormal results are displayed) Labs Reviewed - No data to display  EKG  EKG Interpretation None       Radiology Dg Chest 2 View  Result Date: 11/21/2017 CLINICAL DATA:  Cough for 1 day, low-grade fever and headache. EXAM: CHEST  2 VIEW COMPARISON:  Chest radiograph September 14, 2007 FINDINGS: Cardiomediastinal silhouette is normal. No pleural effusions or focal consolidations. Trachea projects midline and there is no pneumothorax. Soft tissue planes and included osseous structures are non-suspicious. IMPRESSION: Normal chest radiograph. Electronically Signed   By: Awilda Metroourtnay  Bloomer M.D.   On: 11/21/2017 00:21    Procedures Procedures (including critical care time)  Medications Ordered in ED Medications  ibuprofen (ADVIL,MOTRIN) tablet 800 mg (800 mg Oral Given 11/21/17 0012)     Initial Impression / Assessment and Plan / ED Course  I have reviewed the triage vital signs and the nursing notes.  Pertinent labs & imaging results that were available during my care of the patient were reviewed by me and considered in my medical decision making (see chart for details).     Patient presents with upper respiratory symptoms.  He is overall nontoxic appearing.  Initial temperature 100.1.  Chest x-ray  obtained to rule out pneumonia although clinical exam is reassuring.  Suspect viral etiology.  This could be early influenza as well.  Discussed with patient the risk and benefits of Tamiflu.  This was offered to the patient for empiric treatment.  Chest x-ray does not show any evidence of pneumonia.  Patient clinically stable.  Supportive measures recommended otherwise.  After history, exam, and medical workup I feel the patient has been appropriately medically screened and is safe for discharge home. Pertinent diagnoses were discussed with the patient. Patient was given return precautions.   Final Clinical Impressions(s) / ED Diagnoses   Final diagnoses:  Influenza-like illness    ED Discharge Orders        Ordered    ibuprofen (ADVIL,MOTRIN) 600 MG tablet  Every 6 hours PRN     11/21/17 0040    oseltamivir (TAMIFLU) 75 MG capsule  Every 12 hours     11/21/17 0040       Horton, Mayer Masker, MD 11/21/17 0045

## 2018-09-19 IMAGING — DX DG CHEST 2V
2 series · 2 of 2 positions shown · non-contrast
Comparison: Chest radiograph September 14, 2007

CLINICAL DATA: Cough for 1 day, low-grade fever and headache.

EXAM:
CHEST  2 VIEW

[chest pa]
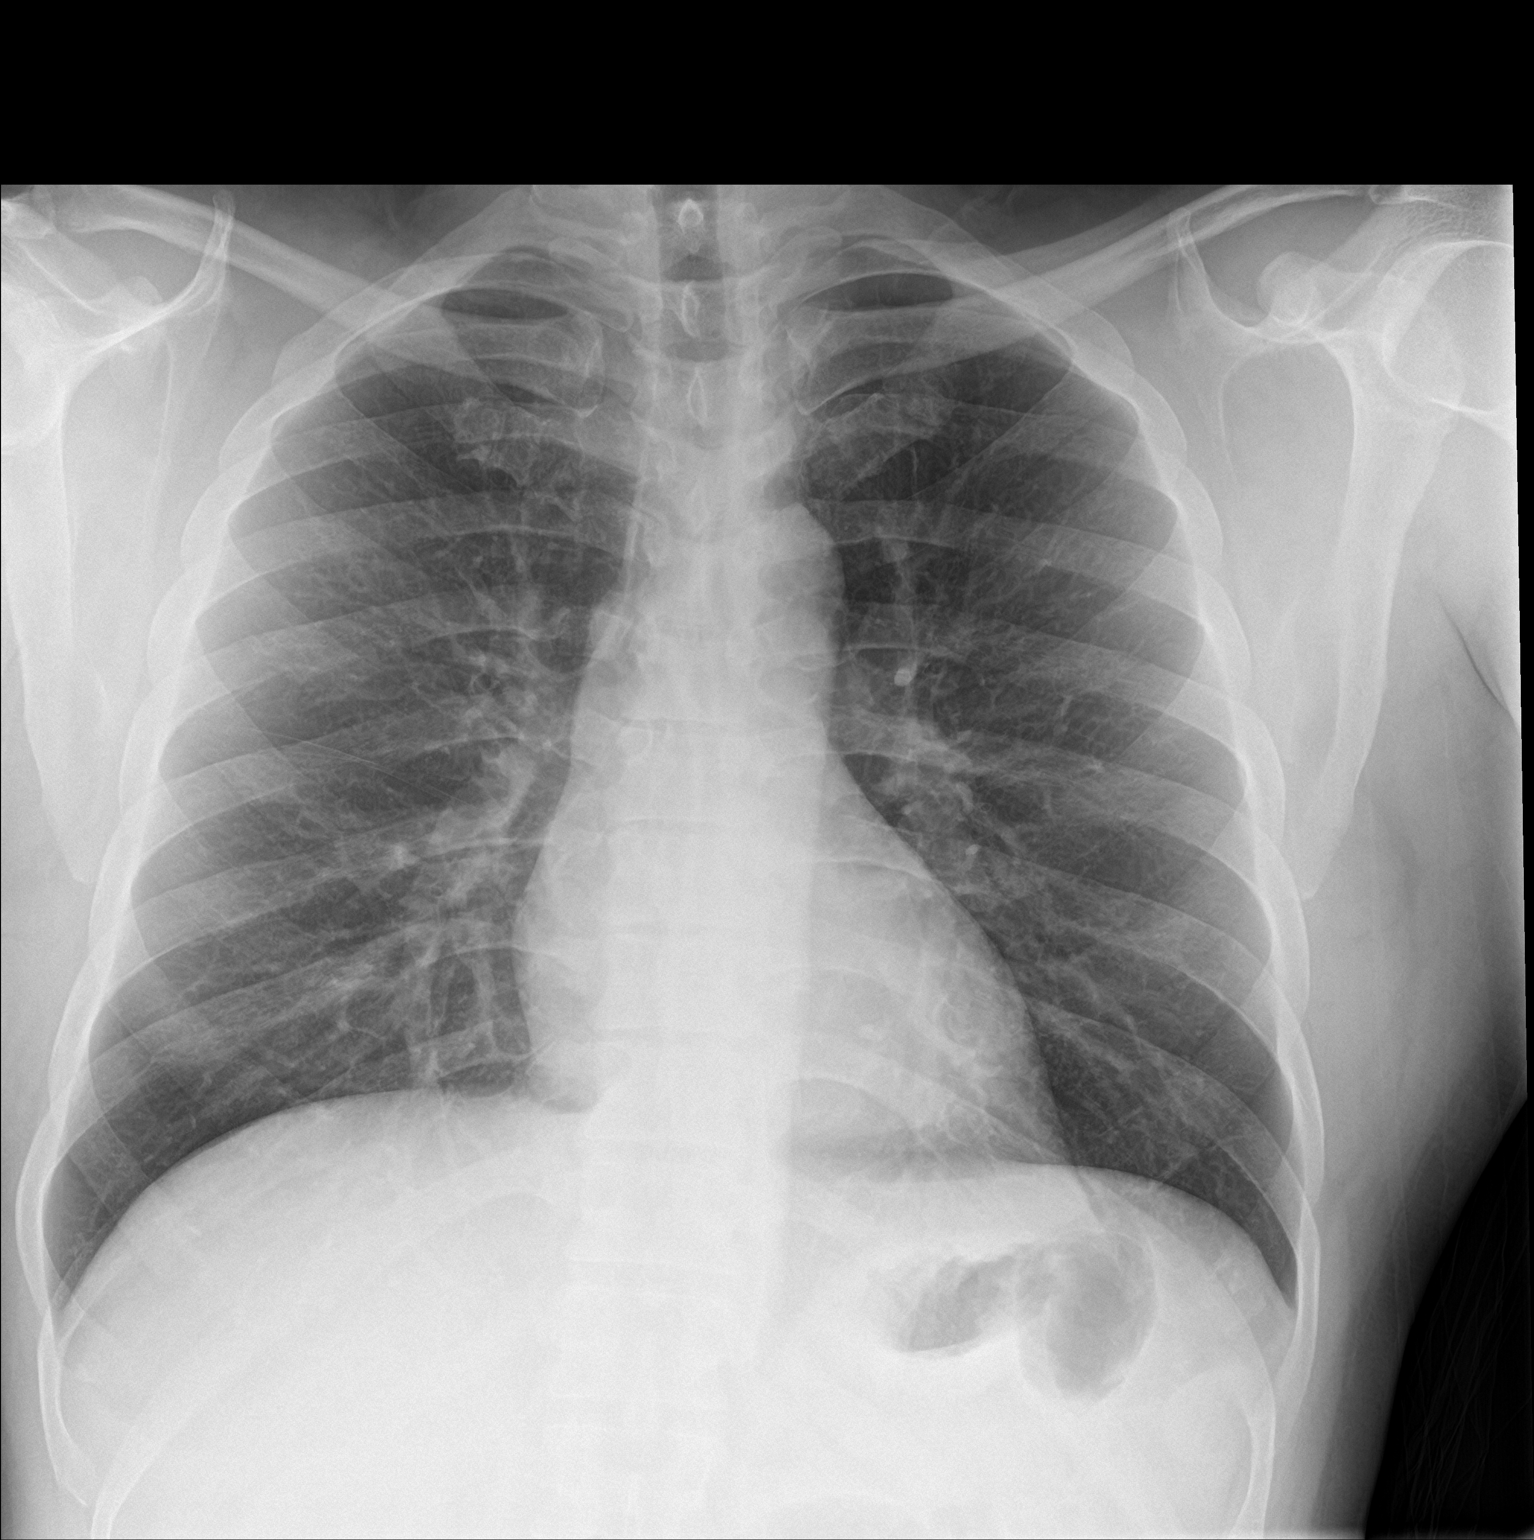

[chest lat]
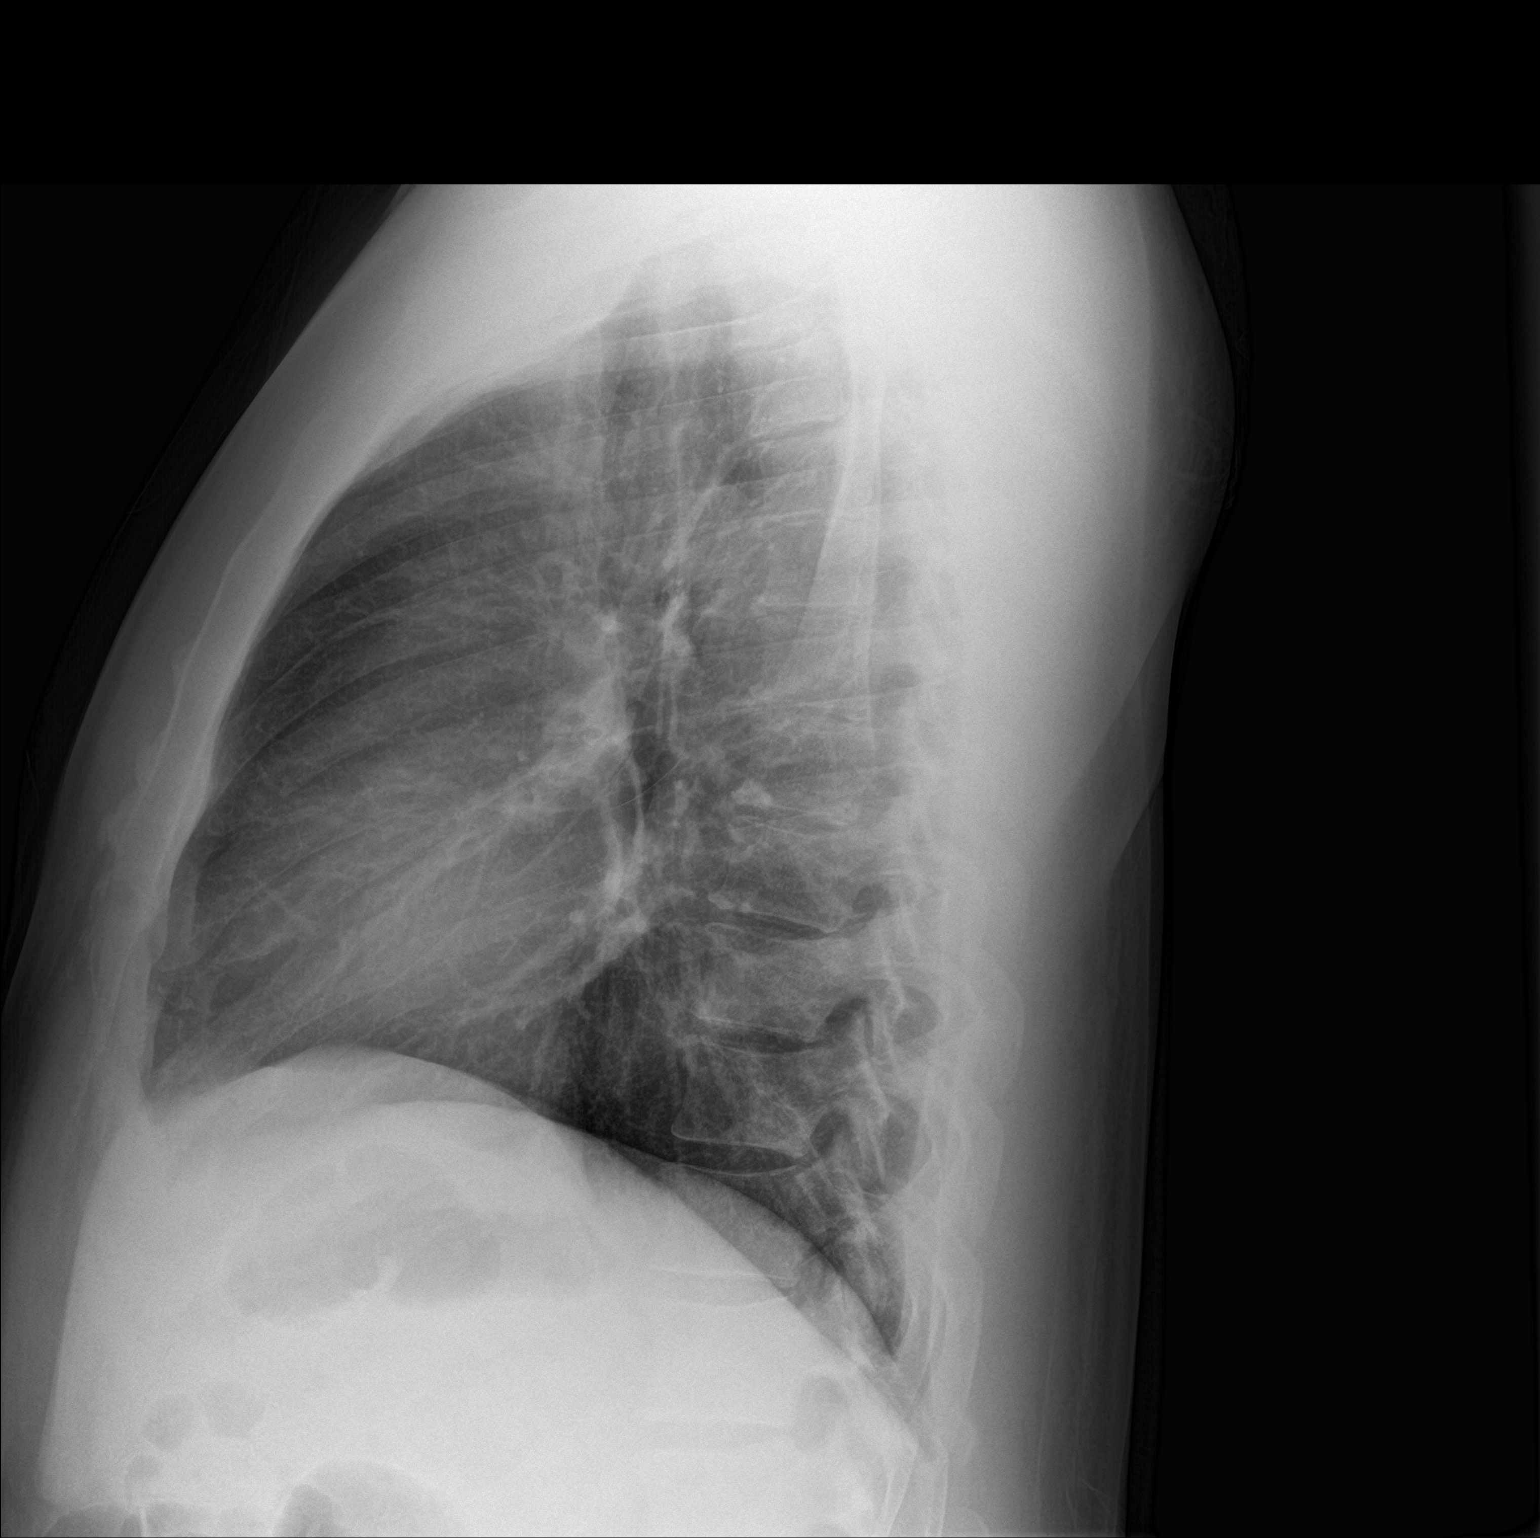

[2 of 2 positions shown; findings below may reference images not displayed]

FINDINGS: Cardiomediastinal silhouette is normal. No pleural effusions or
focal consolidations. Trachea projects midline and there is no
pneumothorax. Soft tissue planes and included osseous structures are
non-suspicious.
IMPRESSION: Normal chest radiograph.

## 2020-02-05 ENCOUNTER — Other Ambulatory Visit: Payer: Self-pay

## 2020-02-05 ENCOUNTER — Emergency Department (HOSPITAL_COMMUNITY)
Admission: EM | Admit: 2020-02-05 | Discharge: 2020-02-05 | Disposition: A | Discharge status: Home / Self Care | Payer: Self-pay | Attending: Emergency Medicine | Admitting: Emergency Medicine

## 2020-02-05 ENCOUNTER — Emergency Department (HOSPITAL_COMMUNITY): Payer: Self-pay

## 2020-02-05 ENCOUNTER — Encounter (HOSPITAL_COMMUNITY): Payer: Self-pay | Admitting: Emergency Medicine

## 2020-02-05 DIAGNOSIS — Z79899 Other long term (current) drug therapy: Secondary | ICD-10-CM | POA: Insufficient documentation

## 2020-02-05 DIAGNOSIS — R55 Syncope and collapse: Secondary | ICD-10-CM | POA: Insufficient documentation

## 2020-02-05 DIAGNOSIS — R5383 Other fatigue: Secondary | ICD-10-CM | POA: Insufficient documentation

## 2020-02-05 LAB — BASIC METABOLIC PANEL
Anion gap: 11 (ref 5–15)
BUN: 8 mg/dL (ref 6–20)
CO2: 25 mmol/L (ref 22–32)
Calcium: 8.6 mg/dL — ABNORMAL LOW (ref 8.9–10.3)
Chloride: 99 mmol/L (ref 98–111)
Creatinine, Ser: 1.38 mg/dL — ABNORMAL HIGH (ref 0.61–1.24)
GFR calc Af Amer: >60 mL/min (ref >60)
GFR calc non Af Amer: >60 mL/min (ref >60)
Glucose, Bld: 133 mg/dL — ABNORMAL HIGH (ref 70–99)
Potassium: 4 mmol/L (ref 3.5–5.1)
Sodium: 135 mmol/L (ref 135–145)

## 2020-02-05 LAB — CBC
HCT: 41.7 % (ref 39.0–52.0)
Hemoglobin: 13.4 g/dL (ref 13.0–17.0)
MCH: 25.4 pg — ABNORMAL LOW (ref 26.0–34.0)
MCHC: 32.1 g/dL (ref 30.0–36.0)
MCV: 79 fL — ABNORMAL LOW (ref 80.0–100.0)
Platelets: 192 10*3/uL (ref 150–400)
RBC: 5.28 MIL/uL (ref 4.22–5.81)
RDW: 13.6 % (ref 11.5–15.5)
WBC: 5.1 10*3/uL (ref 4.0–10.5)
nRBC: 0 % (ref 0.0–0.2)

## 2020-02-05 LAB — TROPONIN I (HIGH SENSITIVITY)
Troponin I (High Sensitivity): 11 ng/L (ref <18)
Troponin I (High Sensitivity): 10 ng/L (ref <18)

## 2020-02-05 LAB — CBG MONITORING, ED: Glucose-Capillary: 123 mg/dL — ABNORMAL HIGH (ref 70–99)

## 2020-02-05 MED ORDER — HEPARIN SODIUM (PORCINE) 5000 UNIT/ML IJ SOLN
INTRAMUSCULAR | Status: AC
  Filled 2020-02-05: qty 1

## 2020-02-05 MED ORDER — ASPIRIN 81 MG PO CHEW
CHEWABLE_TABLET | ORAL | Status: AC
  Filled 2020-02-05: qty 4

## 2020-02-05 MED ORDER — LACTATED RINGERS IV BOLUS
1000.0000 mL | Freq: Once | INTRAVENOUS | Status: AC
  Administered 2020-02-05: 1000 mL via INTRAVENOUS

## 2020-02-05 NOTE — ED Notes (Signed)
324mg  aspirin given at this time

## 2020-02-05 NOTE — ED Provider Notes (Signed)
I saw and evaluated the patient, reviewed the resident's note and I agree with the findings and plan.  EKG: EKG Interpretation  Date/Time:  Monday February 05 2020 15:35:52 EDT Ventricular Rate:  87 PR Interval:    QRS Duration: 89 QT Interval:  336 QTC Calculation: 405 R Axis:   73 Text Interpretation: Sinus rhythm Probable left atrial enlargement ST elev, probable normal early repol pattern Confirmed by Lorre Nick ([id number]) on 02/05/2020 4:08:31 PM    44 year old male here after developing weakness on the barbershop. Denies any chest pain or chest pressure. EKG showed ST elevation in lead V2 with no other changes. Did have some initial hypotension was resolved spontaneously. Has no prior medical history. Labs evaluation are pending at this time   Lorre Nick, MD 02/05/20 1609

## 2020-02-05 NOTE — ED Triage Notes (Addendum)
[place] due witnessed seizure while getting his hair cut. Per bystander pt shook for 30 seconds. When ems arrived pt was alert and ox4. Pt has no history. Pt has been under a lot of stress this week.

## 2020-02-05 NOTE — ED Provider Notes (Signed)
[facility] EMERGENCY DEPARTMENT Provider Note   CSN: [id number] Arrival date & time: 02/05/20  1512     History Chief Complaint  Patient presents with  . Seizures    Oscar Garrett is a 44 y.o. male.  HPI   44 year old male with past medical history of PTSD presenting to the emergency department with concern for sudden onset of fatigue associated with generalized weakness, lightheadedness and diaphoresis.  Patient reports that he was in the mall getting a haircut when his symptoms began.  Patient denies any associated chest pain, nausea, vomiting, shortness of breath, abdominal pain, upper extremity paresthesias, neck pain.  Patient denies any recent illness including fevers, chills, cough, congestion, rhinorrhea abdominal pain, changes in bowel or bladder function.  Patient denies using tobacco or smoking.  Patient denies any family history of sudden cardiac death or heart attacks at young age less than 57 years old.  Patient not have any past history of hypertension, diabetes, hypercholesterolemia.  Patient denies having symptoms similar to this in the past.  No treatments prior to arrival.  He denies any lower extremity swelling or pain, previous blood clots, recent long trips or surgeries, hormone therapy.   Per EMS the patient had a witnessed seizure versus convulsive syncope event at his barber.  Per bystanders the patient lost consciousness for approximately 30 seconds with full body shaking.  Upon EMSs arrival patient was back to his baseline and ANO x4.  Patient has no previous history of seizures.  Past Medical History:  Diagnosis Date  . History of chicken pox     Patient Active Problem List   Diagnosis Date Noted  . 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 KNEE ARTHROSCOPY     left knee acl reconstruction       Family History  Problem Relation Age of Onset  . Breast cancer Mother      Living  . Hypertension Mother   . Healthy Father        Living  . Healthy Brother        x1  . Healthy Sister        x1  . Hypertension Daughter        Controlled  . Sickle cell trait Son   . Sickle cell trait Son   . Heart attack Maternal Grandmother   . Hypertension Maternal Grandmother   . Heart defect Maternal Uncle   . Heart disease Maternal Uncle   . Diabetes Maternal Aunt   . Diabetes Paternal Aunt        x2  . Aneurysm Paternal Aunt     Social History   Tobacco Use  . Smoking status: Never Smoker  . Smokeless tobacco: Never Used  Substance Use Topics  . Alcohol use: No    Alcohol/week: 0.0 standard drinks  . Drug use: No    Home Medications Prior to Admission medications   Medication Sig Start Date End Date Taking? Authorizing Provider  loratadine (CLARITIN) 10 MG tablet Take 10 mg by mouth daily.   Yes [provider]  PARoxetine (PAXIL) 40 MG tablet Take 40 mg by mouth in the morning. 10/02/19  Yes [provider]  SUMAtriptan (IMITREX) 50 MG tablet Take 50 mg by mouth 2 (two) times daily as needed for migraine.  01/28/20  Yes [provider]  cyclobenzaprine (AMRIX) 15 MG 24 hr capsule Take 1 capsule (15 mg total) by mouth daily  as needed for muscle spasms. Patient not taking: Reported on 02/05/2020 09/16/15   Morrell Riddle, PA-C  cyclobenzaprine (FLEXERIL) 10 MG tablet Take 1 tablet (10 mg total) by mouth 3 (three) times daily as needed for muscle spasms. Patient not taking: Reported on 02/05/2020 09/26/15   Ofilia Neas, PA-C  ibuprofen (ADVIL,MOTRIN) 600 MG tablet Take 1 tablet (600 mg total) by mouth every 6 (six) hours as needed. Patient not taking: Reported on 02/05/2020 11/21/17   Shon Baton, MD  ibuprofen (ADVIL,MOTRIN) 800 MG tablet Take 1 tab every eight hours for pain Patient not taking: Reported on 02/05/2020 09/26/15   Ofilia Neas, PA-C  metaxalone (SKELAXIN) 800 MG tablet Take 0.5-1 tablets (400-800 mg  total) by mouth 3 (three) times daily. Patient not taking: Reported on 02/05/2020 09/16/15   Morrell Riddle, PA-C  oseltamivir (TAMIFLU) 75 MG capsule Take 1 capsule (75 mg total) by mouth every 12 (twelve) hours. Patient not taking: Reported on 02/05/2020 11/21/17   Horton, Mayer Masker, MD  traMADol (ULTRAM) 50 MG tablet Take 1-2 tablets (50-100 mg total) by mouth every 8 (eight) hours as needed. Patient not taking: Reported on 02/05/2020 09/19/15   Porfirio Oar, PA    Allergies    Patient has no known allergies.  Review of Systems   Review of Systems  Constitutional: Positive for activity change and fatigue. Negative for appetite change, chills, diaphoresis and fever.  HENT: Negative for congestion and rhinorrhea.   Respiratory: Negative for cough, shortness of breath and wheezing.   Cardiovascular: Negative for chest pain and palpitations.  Gastrointestinal: Negative for abdominal distention, abdominal pain, diarrhea, nausea and vomiting.  Genitourinary: Negative for decreased urine volume, difficulty urinating, dysuria, frequency and urgency.  Musculoskeletal: Negative for gait problem.  Skin: Negative for color change and wound.  Neurological: Positive for weakness (generalized) and light-headedness. Negative for dizziness, syncope and headaches.  All other systems reviewed and are negative.   Physical Exam Updated Vital Signs BP 130/82   Pulse 84   Temp 98.4 F (36.9 C) (Oral)   Resp 19   Ht 6\' 1"  (1.854 m)   Wt 101.2 kg   SpO2 96%   BMI 29.42 kg/m   Physical Exam Vitals and nursing note reviewed.  Constitutional:      General: He is not in acute distress.    Appearance: Normal appearance. He is normal weight. He is not ill-appearing or diaphoretic.  HENT:     Head: Normocephalic.     Right Ear: External ear normal.     Left Ear: External ear normal.     Nose: Nose normal.     Mouth/Throat:     Mouth: Mucous membranes are moist.     Pharynx: Oropharynx is clear.    Eyes:     Extraocular Movements: Extraocular movements intact.  Cardiovascular:     Rate and Rhythm: Normal rate and regular rhythm.     Pulses: Normal pulses.     Heart sounds: Normal heart sounds.  Pulmonary:     Effort: Pulmonary effort is normal. No respiratory distress.     Breath sounds: Normal breath sounds. No wheezing or rhonchi.  Abdominal:     General: Bowel sounds are normal.     Palpations: Abdomen is soft.     Tenderness: There is no abdominal tenderness. There is no guarding.  Musculoskeletal:     Cervical back: Normal range of motion.     Right lower leg: No edema.  Left lower leg: No edema.  Skin:    General: Skin is warm and dry.     Capillary Refill: Capillary refill takes less than 2 seconds.  Neurological:     General: No focal deficit present.     Mental Status: He is alert and oriented to person, place, and time. Mental status is at baseline.  Psychiatric:        Mood and Affect: Mood normal.     ED Results / Procedures / Treatments   Labs (all labs ordered are listed, but only abnormal results are displayed) Labs Reviewed  BASIC METABOLIC PANEL - Abnormal; Notable for the following components:      Result Value   Glucose, Bld 133 (*)    Creatinine, Ser 1.38 (*)    Calcium 8.6 (*)    All other components within normal limits  CBC - Abnormal; Notable for the following components:   MCV 79.0 (*)    MCH 25.4 (*)    All other components within normal limits  CBG MONITORING, ED - Abnormal; Notable for the following components:   Glucose-Capillary 123 (*)    All other components within normal limits  TROPONIN I (HIGH SENSITIVITY)  TROPONIN I (HIGH SENSITIVITY)    EKG EKG Interpretation  Date/Time:  Monday February 05 2020 15:35:52 EDT Ventricular Rate:  87 PR Interval:    QRS Duration: 89 QT Interval:  336 QTC Calculation: 405 R Axis:   73 Text Interpretation: Sinus rhythm Probable left atrial enlargement ST elev, probable normal early  repol pattern Confirmed by Lorre Nick ([id number]) on 02/05/2020 4:08:24 PM   Radiology CT Head Wo Contrast  Result Date: 02/05/2020 CLINICAL DATA:  Seizure. EXAM: CT HEAD WITHOUT CONTRAST TECHNIQUE: Contiguous axial images were obtained from the base of the skull through the vertex without intravenous contrast. COMPARISON:  None. FINDINGS: Brain: No evidence of acute infarction, hemorrhage, hydrocephalus, extra-axial collection or mass lesion/mass effect. Vascular: No hyperdense vessel or unexpected calcification. Skull: Normal. Negative for fracture or focal lesion. Sinuses/Orbits: No acute finding. Minimal ethmoid air cell mucosal thickening. Other: None. IMPRESSION: Normal noncontrast head CT. Electronically Signed   By: Obie Dredge M.D.   On: 02/05/2020 18:25   DG Chest Portable 1 View  Result Date: 02/05/2020 CLINICAL DATA:  Concern for ACS. Additional provided: Witness seizure. EXAM: PORTABLE CHEST 1 VIEW COMPARISON:  Chest radiograph 11/21/2017 FINDINGS: The left lateral costophrenic angle is partially excluded from the field of view. Heart size within normal limits. No evidence of airspace consolidation within the lungs. No evidence of pleural effusion or pneumothorax. No acute bony abnormality is identified. IMPRESSION: No evidence of acute cardiopulmonary abnormality. Electronically Signed   By: Jackey Loge DO   On: 02/05/2020 17:13    Procedures Procedures (including critical care time)  Medications Ordered in ED Medications  aspirin 81 MG chewable tablet (has no administration in time range)  lactated ringers bolus 1,000 mL (0 mLs Intravenous Stopped 02/05/20 1852)    ED Course  I have reviewed the triage vital signs and the nursing notes.  Pertinent labs & imaging results that were available during my care of the patient were reviewed by me and considered in my medical decision making (see chart for details).    MDM Rules/Calculators/A&P                      44 year old  male with past medical history of PTSD presenting to the emergency department with concern for sudden onset  of fatigue associated with generalized weakness, lightheadedness and diaphoresis.  Differential diagnoses considered include ACS, convulsive syncope possibly secondary to hypovolemia, new onset seizure less likely, traumatic injury doubtful without concomitant history or evidence of trauma on exam, less likely PTX, PE, pericarditis or myocarditis, infectious process, dissection, pneumonia, esophageal perforation  Initial interventions rated 324 mg of chewable aspirin, POC glucose 123  ECG interpreted by me demonstrated sinus rhythm at 87 bpm, normal axis, normal intervals, isolated ST elevation in V2, borderline ST depression in V5 and V6 otherwise no overt ST or T wave changes suggestive of acute ischemia, suspect the patient's ST elevation is likely secondary to BER, LAE, no previous for comparison  HEAR score 1  CXR interpreted by me demonstrated no acute cardiopulmonary processes  CT head wo contrast without any acute intracranial abnormalities  Labs demonstrated no leukocytosis, no anemia, no thrombocytopenia or thrombocytosis, initial troponin 11, delta 10,  elevated creatinine to 1.38 with BUN 8, hypocalcemia to 8.6, no other significant derangements on BMP  Upon reassessment patient feeling significantly better following IV fluids.  Patient reports that he has had PE L intake decrease over the past several days.  Patient denies any other additional constitutional or localizing symptoms that would suggest possible infection.  Given the above findings, my suspicion is that patient likely had convulsive syncope and hypovolemia secondary to poor p.o. intake.  The patient is safe and stable for discharge at this time with return precautions provided and a plan for follow up care in place as needed  The plan for this patient was discussed with Dr. Freida Busman, who voiced agreement and who  oversaw evaluation and treatment of this patient.  Final Clinical Impression(s) / ED Diagnoses Final diagnoses:  Convulsive syncope    Rx / DC Orders ED Discharge Orders    None       Gracy Bruins, MD 02/05/20 N[place]    Lorre Nick, MD 02/06/20 1247

## 2020-02-11 ENCOUNTER — Encounter (HOSPITAL_COMMUNITY): Payer: Self-pay | Admitting: Emergency Medicine

## 2020-02-11 ENCOUNTER — Emergency Department (HOSPITAL_COMMUNITY)
Admission: EM | Admit: 2020-02-11 | Discharge: 2020-02-11 | Disposition: A | Discharge status: Home / Self Care | Payer: HRSA Program | Attending: Emergency Medicine | Admitting: Emergency Medicine

## 2020-02-11 ENCOUNTER — Other Ambulatory Visit: Payer: Self-pay

## 2020-02-11 DIAGNOSIS — U071 COVID-19: Secondary | ICD-10-CM | POA: Diagnosis not present

## 2020-02-11 DIAGNOSIS — Z79899 Other long term (current) drug therapy: Secondary | ICD-10-CM | POA: Insufficient documentation

## 2020-02-11 DIAGNOSIS — R0602 Shortness of breath: Secondary | ICD-10-CM | POA: Diagnosis present

## 2020-02-11 HISTORY — DX: COVID-19: U07.1

## 2020-02-11 MED ORDER — ALBUTEROL SULFATE HFA 108 (90 BASE) MCG/ACT IN AERS: 1.0000 | INHALATION_SPRAY | Freq: Four times a day (QID) | RESPIRATORY_TRACT | 0 refills | Status: AC | PRN

## 2020-02-11 NOTE — ED Provider Notes (Signed)
[facility] EMERGENCY DEPARTMENT Provider Note   CSN: [id number] Arrival date & time: 02/11/20  8921     History Chief Complaint  Patient presents with  . COVID +  . Shortness of Breath    Oscar Garrett is a 44 y.o. male.  HPI Patient is 44 year old male with no pertinent past medical history apart from recent Covid diagnosis.  He states that he was found to have Covid on Thursday.  He states that earlier this week he was seen in emergency departments after he had a syncopal episode at his work but was discharged and told that he was most likely dehydrated.  He states that on Thursday he had a Covid test which came back positive.  He states he has had some shortness of breath over the past few days that primarily affects him when he is laying down.  He states he coughs occasionally but does not have any chest pain, fevers, chills, body aches.  He states he is feeling well today and has no symptoms as long as he is not laying down.  He states it feels somewhat positional and that when he lays completely flat he feels more short of breath.  He denies any orthopnea or PND however.  Denies any bilateral leg swelling or unilateral leg swelling, calf tenderness, hemoptysis or heart palpitations.  He states he has no chest discomfort when he is lying down only feels somewhat short of breath.  He denies any wheezing.      Past Medical History:  Diagnosis Date  . COVID-19   . History of chicken pox     Patient Active Problem List   Diagnosis Date Noted  . 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 KNEE ARTHROSCOPY     left knee acl reconstruction       Family History  Problem Relation Age of Onset  . Breast cancer Mother        Living  . Hypertension Mother   . Healthy Father        Living  . Healthy Brother        x1  . Healthy Sister        x1  . Hypertension Daughter        Controlled  .  Sickle cell trait Son   . Sickle cell trait Son   . Heart attack Maternal Grandmother   . Hypertension Maternal Grandmother   . Heart defect Maternal Uncle   . Heart disease Maternal Uncle   . Diabetes Maternal Aunt   . Diabetes Paternal Aunt        x2  . Aneurysm Paternal Aunt     Social History   Tobacco Use  . Smoking status: Never Smoker  . Smokeless tobacco: Never Used  Substance Use Topics  . Alcohol use: No    Alcohol/week: 0.0 standard drinks  . Drug use: No    Home Medications Prior to Admission medications   Medication Sig Start Date End Date Taking? Authorizing Provider  albuterol (VENTOLIN HFA) 108 (90 Base) MCG/ACT inhaler Inhale 1-2 puffs into the lungs every 6 (six) hours as needed for wheezing or shortness of breath. 02/11/20   Imanol Bihl, Rodrigo Ran, PA  cyclobenzaprine (AMRIX) 15 MG 24 hr capsule Take 1 capsule (15 mg total) by mouth daily as needed for muscle spasms. Patient not taking: Reported on 02/05/2020 09/16/15   Morrell Riddle, PA-C  cyclobenzaprine [facility])  10 MG tablet Take 1 tablet (10 mg total) by mouth 3 (three) times daily as needed for muscle spasms. Patient not taking: Reported on 02/05/2020 09/26/15   Ofilia Neas, PA-C  ibuprofen (ADVIL,MOTRIN) 600 MG tablet Take 1 tablet (600 mg total) by mouth every 6 (six) hours as needed. Patient not taking: Reported on 02/05/2020 11/21/17   Shon Baton, MD  ibuprofen (ADVIL,MOTRIN) 800 MG tablet Take 1 tab every eight hours for pain Patient not taking: Reported on 02/05/2020 09/26/15   Ofilia Neas, PA-C  loratadine (CLARITIN) 10 MG tablet Take 10 mg by mouth daily.    [provider]  metaxalone (SKELAXIN) 800 MG tablet Take 0.5-1 tablets (400-800 mg total) by mouth 3 (three) times daily. Patient not taking: Reported on 02/05/2020 09/16/15   Morrell Riddle, PA-C  oseltamivir (TAMIFLU) 75 MG capsule Take 1 capsule (75 mg total) by mouth every 12 (twelve) hours. Patient not taking: Reported on  02/05/2020 11/21/17   Horton, Mayer Masker, MD  PARoxetine (PAXIL) 40 MG tablet Take 40 mg by mouth in the morning. 10/02/19   [provider]  SUMAtriptan (IMITREX) 50 MG tablet Take 50 mg by mouth 2 (two) times daily as needed for migraine.  01/28/20   [provider]  traMADol (ULTRAM) 50 MG tablet Take 1-2 tablets (50-100 mg total) by mouth every 8 (eight) hours as needed. Patient not taking: Reported on 02/05/2020 09/19/15   Oscar Oar, PA    Allergies    Patient has no known allergies.  Review of Systems   Review of Systems  Constitutional: Negative for chills and fever.  HENT: Negative for congestion.   Eyes: Negative for pain.  Respiratory: Positive for shortness of breath. Negative for cough.   Cardiovascular: Negative for chest pain and leg swelling.  Gastrointestinal: Negative for abdominal pain and vomiting.  Genitourinary: Negative for dysuria.  Musculoskeletal: Negative for myalgias.  Skin: Negative for rash.  Neurological: Negative for dizziness and headaches.    Physical Exam Updated Vital Signs BP 128/83 (BP Location: Right Arm)   Pulse 85   Temp 98.2 F (36.8 C) (Oral)   Resp 16   SpO2 100%   Physical Exam Vitals and nursing note reviewed.  Constitutional:      General: He is not in acute distress.    Comments: Pleasant 44 year old male in no acute distress.  HENT:     Head: Normocephalic and atraumatic.     Nose: Nose normal.  Eyes:     General: No scleral icterus. Cardiovascular:     Rate and Rhythm: Normal rate and regular rhythm.     Pulses: Normal pulses.     Heart sounds: Normal heart sounds.  Pulmonary:     Effort: Pulmonary effort is normal. No respiratory distress.     Breath sounds: No wheezing.     Comments: Lungs are clear to auscultation bilaterally.  Speaking in full sentences without difficulty.  Normal respiratory rate. Abdominal:     Palpations: Abdomen is soft.     Tenderness: There is no abdominal tenderness.    Musculoskeletal:     Cervical back: Normal range of motion.     Right lower leg: No edema.     Left lower leg: No edema.  Skin:    General: Skin is warm and dry.     Capillary Refill: Capillary refill takes less than 2 seconds.  Neurological:     Mental Status: He is alert. Mental status is at baseline.  Psychiatric:        Mood and Affect: Mood normal.        Behavior: Behavior normal.     ED Results / Procedures / Treatments   Labs (all labs ordered are listed, but only abnormal results are displayed) Labs Reviewed - No data to display  EKG None  Radiology No results found.  Procedures Procedures (including critical care time)  Medications Ordered in ED Medications - No data to display  ED Course  I have reviewed the triage vital signs and the nursing notes.  Pertinent labs & imaging results that were available during my care of the patient were reviewed by me and considered in my medical decision making (see chart for details).    MDM Rules/Calculators/A&P                      Patient is 44 year old male with no pertinent past medical history presented today for complaints of shortness of breath at night.  He states that he is recently diagnosed with Covid on Thursday and has only had mild symptoms.  He has no chest pain and does not feel short of breath currently.  His physical exam is notable for clear lung sounds and oxygen SPO2 of 100% on room air heart rate is 85 and respiratory rate of 16.  He is extremely well-appearing and has no other concerns other than his occasional nighttime shortness of breath which has not been preventing him from sleeping.  He is requesting albuterol inhaler.  I had a shared decision-making conversation with patient regarding further work-up and he is comfortable with discharging with albuterol at this time.  I discussed with patient that albuterol will likely make him have some mild tachycardia and perhaps make it difficult for him to  sleep he is understanding of this and states he would like to try it.  I think this is reasonable.  I do not hear any wheezing so I have low suspicion that this will be helpful for him however will provide him with paper prescription which he can fill if he would like to.  Otherwise recommend conservative therapy in the form of Tylenol, ibuprofen, conscious full deep breaths with full lung expansion prior to going to bed in lieu of of incentive spirometer which he declined.  Doubt pericarditis as patient has no chest pain associated with the shortness of breath and although the shortness of breath is positional he has no other symptoms.  Doubt myocarditis for the same reasons.  He was recently seen in ED for near syncopal episode/syncopal episode and had full cardiac evaluation and was discharged.  Reviewed all labs and imaging conducted at Prior visit.  I have low suspicion for any acute cardiopulmonary process today causing his symptoms.    Oscar Garrett was evaluated in Emergency Department on 02/11/2020 for the symptoms described in the history of present illness. He was evaluated in the context of the global COVID-19 pandemic, which necessitated consideration that the patient might be at risk for infection with the SARS-CoV-2 virus that causes COVID-19. Institutional protocols and algorithms that pertain to the evaluation of patients at risk for COVID-19 are in a state of rapid change based on information released by regulatory bodies including the CDC and federal and state organizations. These policies and algorithms were followed during the patient's care in the ED.  Final Clinical Impression(s) / ED Diagnoses Final diagnoses:  COVID-19    Rx / DC Orders ED Discharge Orders  Ordered    albuterol (VENTOLIN HFA) 108 (90 Base) MCG/ACT inhaler  Every 6 hours PRN     02/11/20 1038           Pati Gallo [place], [place] 02/11/20 1047    Isla Pence, MD 02/11/20 1218

## 2020-02-11 NOTE — ED Triage Notes (Signed)
Pt states he was diagnosed with COVID on Thursday.  C/o SOB only at night when lying down.  States all other symptoms have resolved.

## 2020-02-11 NOTE — Discharge Instructions (Signed)
Your COVID test is pending; you should expect results in 2-3 days. You can access your results on your MyChart--if you test positive you should receive a phone call.  In the meantime follow CDC guidelines and quarantine, wear a mask, wash hands often.   Please take over the counter vitamin D 2000-4000 units per day. I also recommend zinc 50 mg per day for the next two weeks.   Please return to ED if you feel have difficulty breathing or have emergent, new or concerning symptoms.  Patients who have symptoms consistent with COVID-19 should self isolated for: At least 3 days (72 hours) have passed since recovery, defined as resolution of fever without the use of fever reducing medications and improvement in respiratory symptoms (e.g., cough, shortness of breath), and At least 7 days have passed since symptoms first appeared.       Person Under Monitoring Name: Oscar Garrett  Location: [address] [place] [address]   Infection Prevention Recommendations for Individuals Confirmed to have, or Being Evaluated for, 2019 Novel Coronavirus (COVID-19) Infection Who Receive Care at Home  Individuals who are confirmed to have, or are being evaluated for, COVID-19 should follow the prevention steps below until a healthcare provider or local or state health department says they can return to normal activities.  Stay home except to get medical care You should restrict activities outside your home, except for getting medical care. Do not go to work, school, or public areas, and do not use public transportation or taxis.  Call ahead before visiting your doctor Before your medical appointment, call the healthcare provider and tell them that you have, or are being evaluated for, COVID-19 infection. This will help the healthcare provider's office take steps to keep other people from getting infected. Ask your healthcare provider to call the local or state health department.  Monitor your  symptoms Seek prompt medical attention if your illness is worsening (e.g., difficulty breathing). Before going to your medical appointment, call the healthcare provider and tell them that you have, or are being evaluated for, COVID-19 infection. Ask your healthcare provider to call the local or state health department.  Wear a facemask You should wear a facemask that covers your nose and mouth when you are in the same room with other people and when you visit a healthcare provider. People who live with or visit you should also wear a facemask while they are in the same room with you.  Separate yourself from other people in your home As much as possible, you should stay in a different room from other people in your home. Also, you should use a separate bathroom, if available.  Avoid sharing household items You should not share dishes, drinking glasses, cups, eating utensils, towels, bedding, or other items with other people in your home. After using these items, you should wash them thoroughly with soap and water.  Cover your coughs and sneezes Cover your mouth and nose with a tissue when you cough or sneeze, or you can cough or sneeze into your sleeve. Throw used tissues in a lined trash can, and immediately wash your hands with soap and water for at least 20 seconds or use an alcohol-based hand rub.  Wash your [employer] your hands often and thoroughly with soap and water for at least 20 seconds. You can use an alcohol-based hand sanitizer if soap and water are not available and if your hands are not visibly dirty. Avoid touching your eyes, nose, and  mouth with unwashed hands.   Prevention Steps for Caregivers and Household Members of Individuals Confirmed to have, or Being Evaluated for, COVID-19 Infection Being Cared for in the Home  If you live with, or provide care at home for, a person confirmed to have, or being evaluated for, COVID-19 infection please follow these guidelines  to prevent infection:  Follow healthcare provider's instructions Make sure that you understand and can help the patient follow any healthcare provider instructions for all care.  Provide for the patient's basic needs You should help the patient with basic needs in the home and provide support for getting groceries, prescriptions, and other personal needs.  Monitor the patient's symptoms If they are getting sicker, call his or her medical provider and tell them that the patient has, or is being evaluated for, COVID-19 infection. This will help the healthcare provider's office take steps to keep other people from getting infected. Ask the healthcare provider to call the local or state health department.  Limit the number of people who have contact with the patient If possible, have only one caregiver for the patient. Other household members should stay in another home or place of residence. If this is not possible, they should stay in another room, or be separated from the patient as much as possible. Use a separate bathroom, if available. Restrict visitors who do not have an essential need to be in the home.  Keep older adults, very young children, and other sick people away from the patient Keep older adults, very young children, and those who have compromised immune systems or chronic health conditions away from the patient. This includes people with chronic heart, lung, or kidney conditions, diabetes, and cancer.  Ensure good ventilation Make sure that shared spaces in the home have good air flow, such as from an air conditioner or an opened window, weather permitting.  Wash your hands often Wash your hands often and thoroughly with soap and water for at least 20 seconds. You can use an alcohol based hand sanitizer if soap and water are not available and if your hands are not visibly dirty. Avoid touching your eyes, nose, and mouth with unwashed hands. Use disposable paper towels to  dry your hands. If not available, use dedicated cloth towels and replace them when they become wet.  Wear a facemask and gloves Wear a disposable facemask at all times in the room and gloves when you touch or have contact with the patient's blood, body fluids, and/or secretions or excretions, such as sweat, saliva, sputum, nasal mucus, vomit, urine, or feces.  Ensure the mask fits over your nose and mouth tightly, and do not touch it during use. Throw out disposable facemasks and gloves after using them. Do not reuse. Wash your hands immediately after removing your facemask and gloves. If your personal clothing becomes contaminated, carefully remove clothing and launder. Wash your hands after handling contaminated clothing. Place all used disposable facemasks, gloves, and other waste in a lined container before disposing them with other household waste. Remove gloves and wash your hands immediately after handling these items.  Do not share dishes, glasses, or other household items with the patient Avoid sharing household items. You should not share dishes, drinking glasses, cups, eating utensils, towels, bedding, or other items with a patient who is confirmed to have, or being evaluated for, COVID-19 infection. After the person uses these items, you should wash them thoroughly with soap and water.  Wash laundry thoroughly Immediately remove and wash  clothes or bedding that have blood, body fluids, and/or secretions or excretions, such as sweat, saliva, sputum, nasal mucus, vomit, urine, or feces, on them. Wear gloves when handling laundry from the patient. Read and follow directions on labels of laundry or clothing items and detergent. In general, wash and dry with the warmest temperatures recommended on the label.  Clean all areas the individual has used often Clean all touchable surfaces, such as counters, tabletops, doorknobs, bathroom fixtures, toilets, phones, keyboards, tablets, and bedside  tables, every day. Also, clean any surfaces that may have blood, body fluids, and/or secretions or excretions on them. Wear gloves when cleaning surfaces the patient has come in contact with. Use a diluted bleach solution (e.g., dilute bleach with 1 part bleach and 10 parts water) or a household disinfectant with a label that says EPA-registered for coronaviruses. To make a bleach solution at home, add 1 tablespoon of bleach to 1 quart (4 cups) of water. For a larger supply, add  cup of bleach to 1 gallon (16 cups) of water. Read labels of cleaning products and follow recommendations provided on product labels. Labels contain instructions for safe and effective use of the cleaning product including precautions you should take when applying the product, such as wearing gloves or eye protection and making sure you have good ventilation during use of the product. Remove gloves and wash hands immediately after cleaning.  Monitor yourself for signs and symptoms of illness Caregivers and household members are considered close contacts, should monitor their health, and will be asked to limit movement outside of the home to the extent possible. Follow the monitoring steps for close contacts listed on the symptom monitoring form.   ? If you have additional questions, contact your local health department or call the epidemiologist on call at [phone] (available 24/7). ? This guidance is subject to change. For the most up-to-date guidance from [facility], please refer to their website: [email or web address]

## 2020-12-04 IMAGING — DX DG CHEST 1V PORT
1 series · 2 of 2 positions shown · non-contrast
Comparison: Chest radiograph 11/21/2017

CLINICAL DATA: Concern for ACS. Additional provided: Witness
seizure.

EXAM:
PORTABLE CHEST 1 VIEW

[Series 1: chest · 0.14mm/px · 2 of 2 slices shown]
[im 1/2]
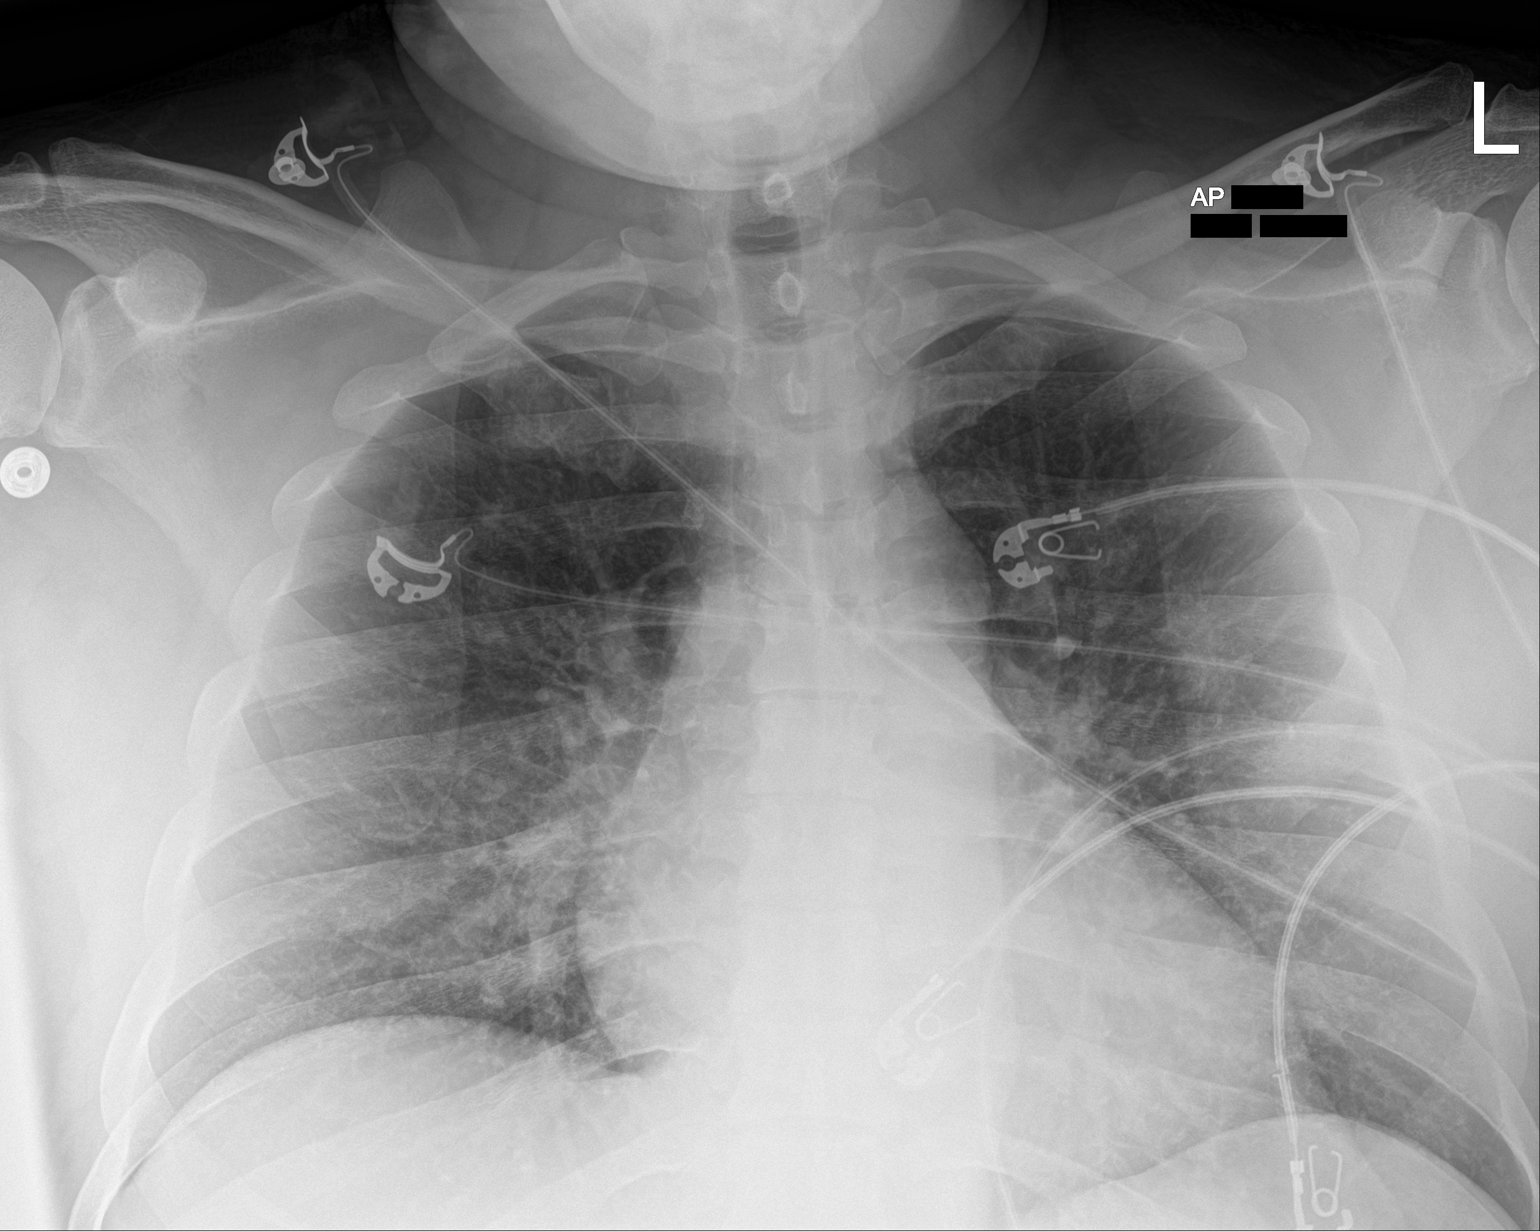
[im 2/2]
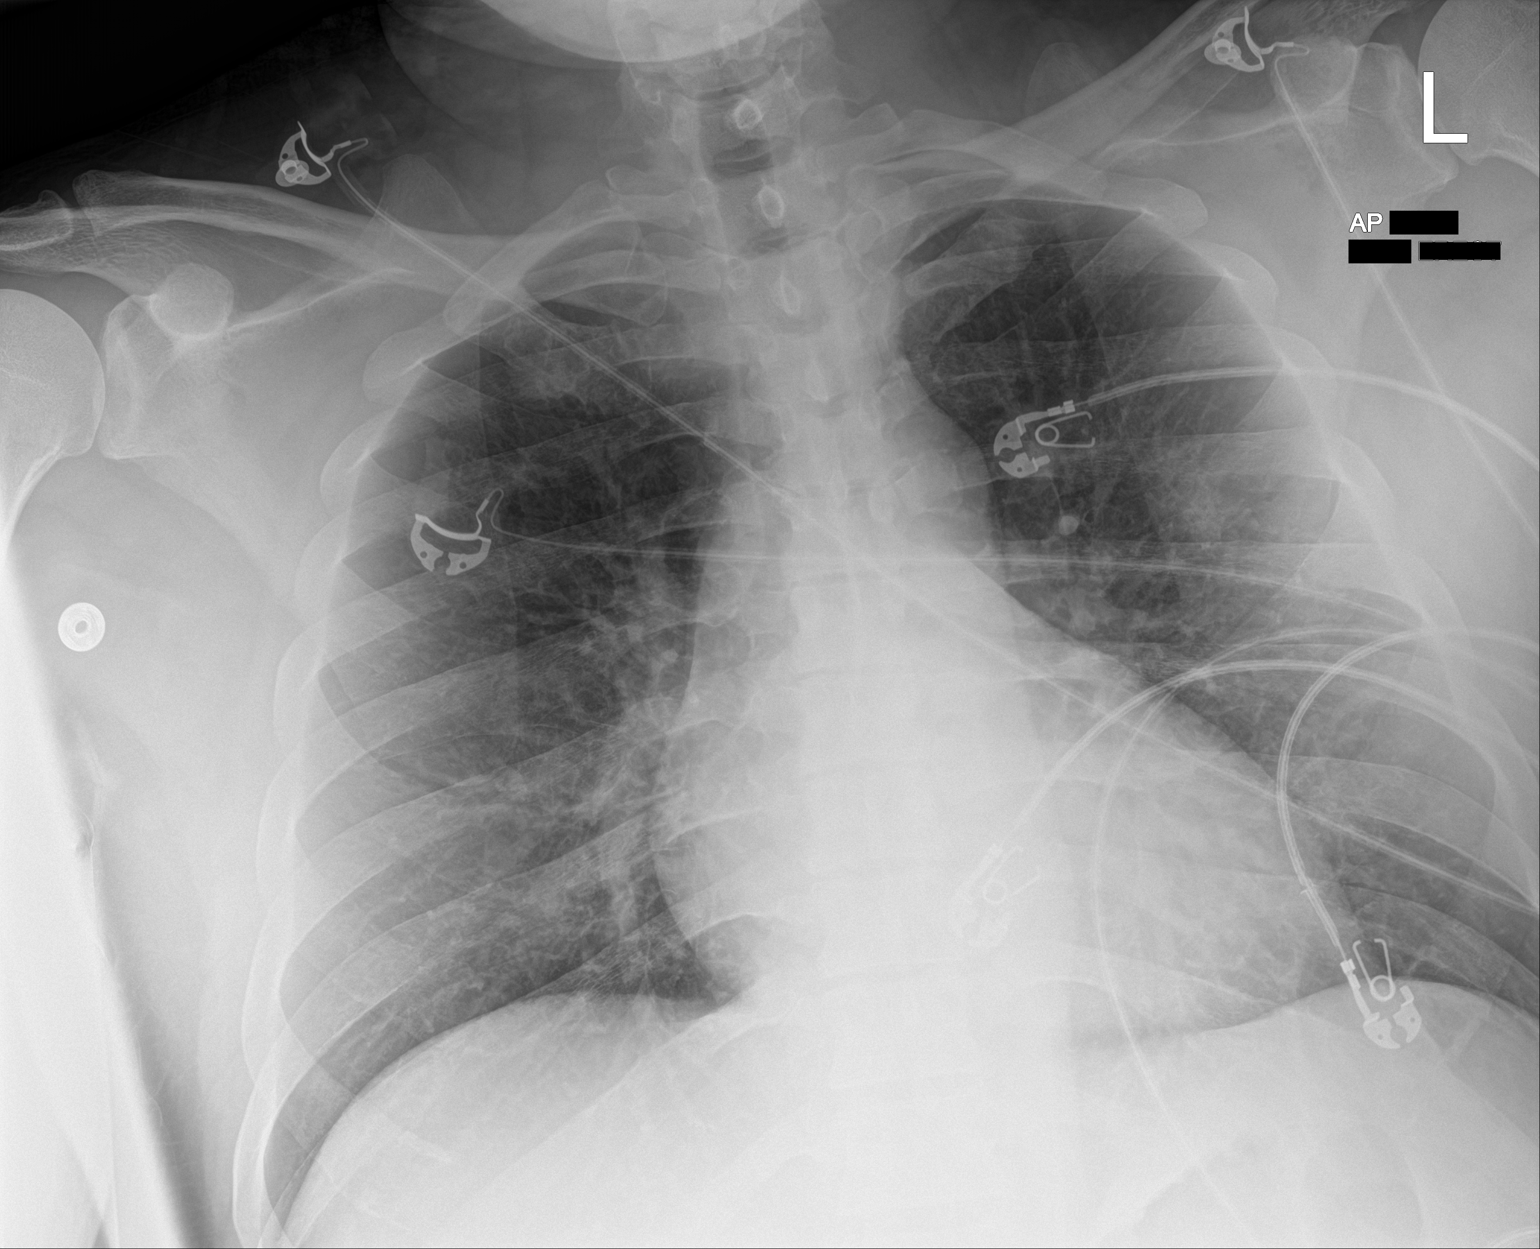

[2 of 2 positions shown; findings below may reference images not displayed]

FINDINGS: The left lateral costophrenic angle is partially excluded from the
field of view. Heart size within normal limits. No evidence of
airspace consolidation within the lungs. No evidence of pleural
effusion or pneumothorax. No acute bony abnormality is identified.
IMPRESSION: No evidence of acute cardiopulmonary abnormality.

## 2020-12-06 ENCOUNTER — Encounter (HOSPITAL_BASED_OUTPATIENT_CLINIC_OR_DEPARTMENT_OTHER): Payer: Self-pay | Admitting: *Deleted

## 2020-12-06 ENCOUNTER — Other Ambulatory Visit: Payer: Self-pay

## 2020-12-06 ENCOUNTER — Emergency Department (HOSPITAL_BASED_OUTPATIENT_CLINIC_OR_DEPARTMENT_OTHER)
Admission: EM | Admit: 2020-12-06 | Discharge: 2020-12-06 | Disposition: A | Discharge status: Home / Self Care | Payer: Medicaid Other | Attending: Emergency Medicine | Admitting: Emergency Medicine

## 2020-12-06 DIAGNOSIS — I10 Essential (primary) hypertension: Secondary | ICD-10-CM | POA: Insufficient documentation

## 2020-12-06 DIAGNOSIS — R519 Headache, unspecified: Secondary | ICD-10-CM | POA: Insufficient documentation

## 2020-12-06 DIAGNOSIS — Z8669 Personal history of other diseases of the nervous system and sense organs: Secondary | ICD-10-CM | POA: Insufficient documentation

## 2020-12-06 DIAGNOSIS — Z79899 Other long term (current) drug therapy: Secondary | ICD-10-CM | POA: Insufficient documentation

## 2020-12-06 DIAGNOSIS — Z8616 Personal history of COVID-19: Secondary | ICD-10-CM | POA: Insufficient documentation

## 2020-12-06 HISTORY — DX: Post-traumatic stress disorder, unspecified: F43.10

## 2020-12-06 HISTORY — DX: Anxiety disorder, unspecified: F41.9

## 2020-12-06 NOTE — Progress Notes (Addendum)
159 pm TOC CM spoke to pt and provided contact number to call to arrange appt with [facility] or [facility]. Explained [facility] has a discounted pharmacy to help with meds. And financial counselors. Isidoro Donning RN CCM, [facility] ED TOC CM [phone]  154 pm TOC CM attempted call to pt to follow up on referral to arrange appt with PCP. Pt does not currently have insurance. [facility] added to [employer]. Pt can call to arrange appt that fits his schedule. ED provider and RN updated. Isidoro Donning RN CCM, [facility] ED TOC CM [phone]

## 2020-12-06 NOTE — Discharge Instructions (Signed)
You will need to continue to keep a log of your blood pressures at home.  I have consulted our social work team to help get you scheduled for a primary care appointment and I have also given you information for a clinic to follow up with.   Please call the office to schedule an appointment. If you have any new or worsening symptoms in the meantime such as chest pain, shortness of breath, headaches, or stroke symptoms you will need to return to the emergency department immediately.
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
## 2020-12-06 NOTE — ED Triage Notes (Signed)
Headache since last night. 2 months ago his BP was elevated but he was not started on BP medication or diagnosed with HTN. He took his BP this am with his at home machine and it was elevated.

## 2022-10-07 ENCOUNTER — Other Ambulatory Visit: Payer: Self-pay

## 2022-10-07 ENCOUNTER — Emergency Department (HOSPITAL_BASED_OUTPATIENT_CLINIC_OR_DEPARTMENT_OTHER)
Admission: EM | Admit: 2022-10-07 | Discharge: 2022-10-07 | Disposition: A | Discharge status: Home / Self Care | Payer: Medicare HMO | Attending: Emergency Medicine | Admitting: Emergency Medicine

## 2022-10-07 DIAGNOSIS — Z760 Encounter for issue of repeat prescription: Secondary | ICD-10-CM | POA: Diagnosis present

## 2022-10-07 MED ORDER — PAROXETINE HCL 40 MG PO TABS: 40.0000 mg | ORAL_TABLET | Freq: Every morning | ORAL | 0 refills | Status: DC

## 2022-10-07 NOTE — Discharge Instructions (Signed)
Please follow-up with your primary care doctor, when able for refill of your medications.

## 2022-10-07 NOTE — ED Provider Notes (Signed)
[facility] EMERGENCY DEPARTMENT Provider Note   CSN: [id number] Arrival date & time: 10/07/22  1134     History  Chief Complaint  Patient presents with   Medication Refill    Oscar Garrett is a 46 y.o. male.  Patient presents to the emergency department today for medication refill.  He states that he recently regained insurance and is transferring his doctors over to new insurance.  He ran out of paroxetine 1 week ago.  Since that time he has had nausea and decreased appetite along with headache.  Denies fevers, congestion, cough, sore throat.  No known sick contacts.  No vomiting.  No abdominal pain or chest pain.  He feels that his symptoms could be due to not taking his medication.       Home Medications Prior to Admission medications   Medication Sig Start Date End Date Taking? Authorizing Provider  albuterol (VENTOLIN HFA) 108 (90 Base) MCG/ACT inhaler Inhale 1-2 puffs into the lungs every 6 (six) hours as needed for wheezing or shortness of breath. 02/11/20   Fondaw, Rodrigo Ran, PA  baclofen (LIORESAL) 10 MG tablet Take 10 mg by mouth 3 (three) times daily.    [provider]  loratadine (CLARITIN) 10 MG tablet Take 10 mg by mouth daily.    [provider]  PARoxetine (PAXIL) 40 MG tablet Take 1 tablet (40 mg total) by mouth in the morning. 10/07/22   Renne Crigler, PA-C  SUMAtriptan (IMITREX) 50 MG tablet Take 50 mg by mouth 2 (two) times daily as needed for migraine.  01/28/20   [provider]      Allergies    Patient has no known allergies.    Review of Systems   Review of Systems  Physical Exam Updated Vital Signs BP (!) 154/106 (BP Location: Right Arm)   Pulse 71   Temp 97.7 F (36.5 C)   Resp 18   Ht 6\' 1"  (1.854 m)   Wt 95.3 kg   SpO2 100%   BMI 27.71 kg/m  Physical Exam Vitals and nursing note reviewed.  Constitutional:      General: He is not in acute distress.    Appearance: He is well-developed.  HENT:      Head: Normocephalic and atraumatic.     Nose: Nose normal.     Mouth/Throat:     Mouth: Mucous membranes are moist.  Eyes:     General:        Right eye: No discharge.        Left eye: No discharge.     Conjunctiva/sclera: Conjunctivae normal.  Cardiovascular:     Rate and Rhythm: Normal rate and regular rhythm.     Heart sounds: Normal heart sounds.  Pulmonary:     Effort: Pulmonary effort is normal.     Breath sounds: Normal breath sounds.  Abdominal:     Palpations: Abdomen is soft.     Tenderness: There is no abdominal tenderness. There is no guarding or rebound.  Musculoskeletal:     Cervical back: Normal range of motion and neck supple.  Skin:    General: Skin is warm and dry.  Neurological:     Mental Status: He is alert.     ED Results / Procedures / Treatments   Labs (all labs ordered are listed, but only abnormal results are displayed) Labs Reviewed - No data to display  EKG None  Radiology No results found.  Procedures Procedures    Medications  Ordered in ED Medications - No data to display  ED Course/ Medical Decision Making/ A&P    Patient seen and examined. History obtained directly from patient.   Labs/EKG: None ordered  Imaging: None ordered  Medications/Fluids: None ordered  Most recent vital signs reviewed and are as follows: BP (!) 154/106 (BP Location: Right Arm)   Pulse 71   Temp 97.7 F (36.5 C)   Resp 18   Ht 6\' 1"  (1.854 m)   Wt 95.3 kg   SpO2 100%   BMI 27.71 kg/m   Initial impression: Encounter for medication refill, mild withdrawal symptoms  Home treatment plan: Restart paroxetine, Tylenol/ibuprofen for headaches  Return instructions discussed with patient: Worsening or new or changing symptoms.  Follow-up instructions discussed with patient: Follow-up with PCP and psychiatry when able                          Medical Decision Making Risk Prescription drug management.   Patient here requesting refill for  paroxetine.  He has been out for approximately a week.  It sounds like he is having some mild withdrawal symptoms.  No concern for flu or other infection at this time.  I can see that he has been prescribed this medication in the past.  Will provide refill at this time.  Patient states that he is making plans to follow-up with new providers in the next several weeks.  The patient's vital signs, pertinent lab work and imaging were reviewed and interpreted as discussed in the ED course. Hospitalization was considered for further testing, treatments, or serial exams/observation. However as patient is well-appearing, has a stable exam, and reassuring studies today, I do not feel that they warrant admission at this time. This plan was discussed with the patient who verbalizes agreement and comfort with this plan and seems reliable and able to return to the Emergency Department with worsening or changing symptoms.          Final Clinical Impression(s) / ED Diagnoses Final diagnoses:  Medication refill    Rx / DC Orders ED Discharge Orders          Ordered    PARoxetine (PAXIL) 40 MG tablet  Every morning        10/07/22 1405              10/09/22, PA-C 10/07/22 1409    10/09/22, MD 10/07/22 1527

## 2022-10-07 NOTE — ED Triage Notes (Signed)
Pt takes 40mg  paroxetine for anxiety daily and ran out 7 days ago. Pt does not have any refills and is in process of getting new therapist. Pt states decreased appetite, light headed and nauseated since being out of meds.

## 2022-10-07 NOTE — ED Notes (Signed)
Discharge instructions reviewed with patient. Patient verbalizes understanding, no further questions at this time. Medications/prescriptions and follow up information provided. No acute distress noted at time of departure.  

## 2022-11-05 ENCOUNTER — Encounter (HOSPITAL_BASED_OUTPATIENT_CLINIC_OR_DEPARTMENT_OTHER): Payer: Self-pay | Admitting: Emergency Medicine

## 2022-11-05 ENCOUNTER — Emergency Department (HOSPITAL_BASED_OUTPATIENT_CLINIC_OR_DEPARTMENT_OTHER)
Admission: EM | Admit: 2022-11-05 | Discharge: 2022-11-05 | Disposition: A | Discharge status: Home / Self Care | Payer: Medicare HMO | Attending: Emergency Medicine | Admitting: Emergency Medicine

## 2022-11-05 ENCOUNTER — Other Ambulatory Visit: Payer: Self-pay

## 2022-11-05 DIAGNOSIS — F419 Anxiety disorder, unspecified: Secondary | ICD-10-CM | POA: Diagnosis not present

## 2022-11-05 DIAGNOSIS — F32A Depression, unspecified: Secondary | ICD-10-CM | POA: Insufficient documentation

## 2022-11-05 DIAGNOSIS — Z760 Encounter for issue of repeat prescription: Secondary | ICD-10-CM | POA: Insufficient documentation

## 2022-11-05 MED ORDER — PAROXETINE HCL 40 MG PO TABS: 40.0000 mg | ORAL_TABLET | Freq: Every morning | ORAL | 0 refills | Status: AC

## 2022-11-05 NOTE — ED Notes (Signed)
Pt discharged to home by provider Chris, PA.  

## 2022-11-05 NOTE — Discharge Instructions (Signed)
Please return with any new or worsening signs or symptoms Please pick up prescription that I have sent in Please follow up with PCP on 1/28 as discussed

## 2022-11-05 NOTE — ED Provider Notes (Signed)
[facility] EMERGENCY DEPARTMENT Provider Note   CSN: [id number] Arrival date & time: 11/05/22  1352     History  Chief Complaint  Patient presents with   Medication Refill    Oscar Garrett is a 47 y.o. male with medical history of PTSD, anxiety.  Patient presents to ED for evaluation of medication refill.  Patient reports that for the last 2 years he is taking paroxetine 40 mg for anxiety and depression.  Patient reports that he has had insurance issues recently which has caused him to have to change PCPs.  Patient reports that he has been unable to get into see his PCP recently due to having to have a new patient appointment.  Patient is here requesting medication refill for his paroxetine.  Patient states he has been sleeping appropriately, getting plenty of rest.  Patient reports he is eating and drinking appropriately.  Patient denies any SI, HI, AVH.  Patient denies any medical complaints.   Medication Refill      Home Medications Prior to Admission medications   Medication Sig Start Date End Date Taking? Authorizing Provider  PARoxetine (PAXIL) 40 MG tablet Take 1 tablet (40 mg total) by mouth in the morning. 11/05/22  Yes Azucena Cecil, PA-C  albuterol (VENTOLIN HFA) 108 (90 Base) MCG/ACT inhaler Inhale 1-2 puffs into the lungs every 6 (six) hours as needed for wheezing or shortness of breath. 02/11/20   Fondaw, Kathleene Hazel, PA  baclofen (LIORESAL) 10 MG tablet Take 10 mg by mouth 3 (three) times daily.    [provider]  loratadine (CLARITIN) 10 MG tablet Take 10 mg by mouth daily.    [provider]  SUMAtriptan (IMITREX) 50 MG tablet Take 50 mg by mouth 2 (two) times daily as needed for migraine.  01/28/20   [provider]      Allergies    Patient has no known allergies.    Review of Systems   Review of Systems  All other systems reviewed and are negative.   Physical Exam Updated Vital Signs BP (!) 170/119 (BP  Location: Left Arm)   Pulse 97   Temp 98.1 F (36.7 C) (Oral)   Resp 16   Ht 6\' 1"  (1.854 m)   Wt 96.6 kg   SpO2 98%   BMI 28.10 kg/m  Physical Exam Vitals and nursing note reviewed.  Constitutional:      General: He is not in acute distress.    Appearance: He is well-developed.  HENT:     Head: Normocephalic and atraumatic.  Eyes:     Conjunctiva/sclera: Conjunctivae normal.  Cardiovascular:     Rate and Rhythm: Normal rate and regular rhythm.     Heart sounds: No murmur heard. Pulmonary:     Effort: Pulmonary effort is normal. No respiratory distress.     Breath sounds: Normal breath sounds. No wheezing.  Abdominal:     Palpations: Abdomen is soft.     Tenderness: There is no abdominal tenderness.  Musculoskeletal:        General: No swelling.     Cervical back: Neck supple.  Skin:    General: Skin is warm and dry.     Capillary Refill: Capillary refill takes less than 2 seconds.  Neurological:     Mental Status: He is alert.  Psychiatric:        Mood and Affect: Mood normal.     ED Results / Procedures / Treatments   Labs (all labs  ordered are listed, but only abnormal results are displayed) Labs Reviewed - No data to display  EKG None  Radiology No results found.  Procedures Procedures    Medications Ordered in ED Medications - No data to display  ED Course/ Medical Decision Making/ A&P                          Medical Decision Making Risk Prescription drug management.   47 year old male presents to ED for evaluation.  Please see HPI for further details.  Patient here for medication refill.  Patient denies any medical complaints.  The patient is afebrile and nontachycardic.  The patient lung sounds are clear bilaterally, not hypoxic on room air.  Patient abdomen soft and compressible throughout.  Patient alert and oriented x 3.  Patient is well-groomed, well-kept.  Patient in no apparent distress.  Patient denies SI, HI, AVH.   Patient  paroxetine refilled.  The patient was sent home with 30-day supply of medication.  Patient encouraged to establish relationship with PCP for refills.  Patient voiced understanding of instructions.  Patient given return precautions and he voiced understanding.  Patient had all his questions answered to his satisfaction.  The patient is stable for discharge.  Final Clinical Impression(s) / ED Diagnoses Final diagnoses:  Medication refill    Rx / DC Orders ED Discharge Orders          Ordered    PARoxetine (PAXIL) 40 MG tablet  Every morning        11/05/22 1615              Azucena Cecil, PA-C 11/05/22 1619    Charlesetta Shanks, MD 11/05/22 2019

## 2022-11-05 NOTE — ED Triage Notes (Signed)
Pt needs medication refill for paroxetine.  Pt is short about one weeks dose.  Pt has appt on 11/14/22 for PCP
